# Patient Record
Sex: Male | Born: 1950 | Race: Black or African American | Hispanic: No | Marital: Single | Smoking: Never smoker
Health system: Southern US, Community
[De-identification: ages and names within clinical notes are randomized; demographics above are authoritative.]

## PROBLEM LIST (undated history)

## (undated) DIAGNOSIS — R17 Unspecified jaundice: Secondary | ICD-10-CM

## (undated) DIAGNOSIS — R0683 Snoring: Secondary | ICD-10-CM

## (undated) DIAGNOSIS — I10 Essential (primary) hypertension: Secondary | ICD-10-CM

## (undated) DIAGNOSIS — R5383 Other fatigue: Secondary | ICD-10-CM

## (undated) DIAGNOSIS — N529 Male erectile dysfunction, unspecified: Secondary | ICD-10-CM

## (undated) DIAGNOSIS — R7303 Prediabetes: Secondary | ICD-10-CM

## (undated) DIAGNOSIS — M109 Gout, unspecified: Secondary | ICD-10-CM

## (undated) DIAGNOSIS — K8020 Calculus of gallbladder without cholecystitis without obstruction: Secondary | ICD-10-CM

## (undated) HISTORY — DX: Unspecified jaundice: R17

## (undated) HISTORY — DX: Other fatigue: R53.83

## (undated) HISTORY — PX: MOUTH SURGERY: SHX715

## (undated) HISTORY — DX: Prediabetes: R73.03

## (undated) HISTORY — DX: Snoring: R06.83

## (undated) HISTORY — DX: Calculus of gallbladder without cholecystitis without obstruction: K80.20

## (undated) HISTORY — DX: Male erectile dysfunction, unspecified: N52.9

---

## 2001-05-02 ENCOUNTER — Inpatient Hospital Stay (HOSPITAL_COMMUNITY): Admission: EM | Admit: 2001-05-02 | Discharge: 2001-05-06 | Payer: Self-pay | Admitting: Emergency Medicine

## 2001-05-02 ENCOUNTER — Encounter: Payer: Self-pay | Admitting: Emergency Medicine

## 2005-01-24 ENCOUNTER — Inpatient Hospital Stay: Payer: Self-pay | Admitting: Anesthesiology

## 2005-07-16 ENCOUNTER — Emergency Department: Payer: Self-pay | Admitting: Emergency Medicine

## 2005-07-16 ENCOUNTER — Other Ambulatory Visit: Payer: Self-pay

## 2007-04-08 ENCOUNTER — Emergency Department: Payer: Self-pay | Admitting: Emergency Medicine

## 2009-01-21 ENCOUNTER — Emergency Department: Payer: Self-pay | Admitting: Emergency Medicine

## 2012-11-12 DIAGNOSIS — D369 Benign neoplasm, unspecified site: Secondary | ICD-10-CM

## 2012-11-12 HISTORY — PX: COLONOSCOPY: SHX174

## 2012-11-12 HISTORY — DX: Benign neoplasm, unspecified site: D36.9

## 2013-05-20 ENCOUNTER — Ambulatory Visit: Payer: Self-pay | Admitting: Gastroenterology

## 2013-05-21 LAB — PATHOLOGY REPORT

## 2014-11-25 ENCOUNTER — Emergency Department: Payer: Self-pay | Admitting: Emergency Medicine

## 2015-05-02 ENCOUNTER — Other Ambulatory Visit
Admission: RE | Admit: 2015-05-02 | Discharge: 2015-05-02 | Disposition: A | Discharge status: Home / Self Care | Payer: BLUE CROSS/BLUE SHIELD | Source: Other Acute Inpatient Hospital | Attending: Rheumatology | Admitting: Rheumatology

## 2015-05-02 DIAGNOSIS — M25562 Pain in left knee: Secondary | ICD-10-CM | POA: Diagnosis not present

## 2015-05-02 LAB — SYNOVIAL CELL COUNT + DIFF, W/ CRYSTALS
Crystals, Fluid: NONE SEEN
Eosinophils-Synovial: 0 %
Lymphocytes-Synovial Fld: 81 %
Monocyte-Macrophage-Synovial Fluid: 14 %
Neutrophil, Synovial: 5 %
Other Cells-SYN: 0
WBC, Synovial: 569 /mm3 — ABNORMAL HIGH (ref 0–200)

## 2021-11-12 DIAGNOSIS — C229 Malignant neoplasm of liver, not specified as primary or secondary: Secondary | ICD-10-CM

## 2021-11-12 HISTORY — DX: Malignant neoplasm of liver, not specified as primary or secondary: C22.9

## 2021-12-01 ENCOUNTER — Emergency Department
Admission: EM | Admit: 2021-12-01 | Discharge: 2021-12-01 | Discharge status: Against Medical Advice | Payer: Medicare HMO | Attending: Emergency Medicine | Admitting: Emergency Medicine

## 2021-12-01 ENCOUNTER — Other Ambulatory Visit: Payer: Self-pay

## 2021-12-01 ENCOUNTER — Encounter: Payer: Self-pay | Admitting: Emergency Medicine

## 2021-12-01 ENCOUNTER — Emergency Department: Payer: Medicare HMO

## 2021-12-01 DIAGNOSIS — R0602 Shortness of breath: Secondary | ICD-10-CM | POA: Diagnosis present

## 2021-12-01 DIAGNOSIS — Z5321 Procedure and treatment not carried out due to patient leaving prior to being seen by health care provider: Secondary | ICD-10-CM | POA: Insufficient documentation

## 2021-12-01 HISTORY — DX: Gout, unspecified: M10.9

## 2021-12-01 HISTORY — DX: Essential (primary) hypertension: I10

## 2021-12-01 LAB — BASIC METABOLIC PANEL
Anion gap: 5 (ref 5–15)
BUN: 15 mg/dL (ref 8–23)
CO2: 30 mmol/L (ref 22–32)
Calcium: 8.8 mg/dL — ABNORMAL LOW (ref 8.9–10.3)
Chloride: 97 mmol/L — ABNORMAL LOW (ref 98–111)
Creatinine, Ser: 1.46 mg/dL — ABNORMAL HIGH (ref 0.61–1.24)
GFR, Estimated: 51 mL/min — ABNORMAL LOW (ref >60)
Glucose, Bld: 219 mg/dL — ABNORMAL HIGH (ref 70–99)
Potassium: 4.4 mmol/L (ref 3.5–5.1)
Sodium: 132 mmol/L — ABNORMAL LOW (ref 135–145)

## 2021-12-01 LAB — CBC
HCT: 47.5 % (ref 39.0–52.0)
Hemoglobin: 16 g/dL (ref 13.0–17.0)
MCH: 28.9 pg (ref 26.0–34.0)
MCHC: 33.7 g/dL (ref 30.0–36.0)
MCV: 85.9 fL (ref 80.0–100.0)
Platelets: 142 10*3/uL — ABNORMAL LOW (ref 150–400)
RBC: 5.53 MIL/uL (ref 4.22–5.81)
RDW: 15.8 % — ABNORMAL HIGH (ref 11.5–15.5)
WBC: 8.4 10*3/uL (ref 4.0–10.5)
nRBC: 0 % (ref 0.0–0.2)

## 2021-12-01 NOTE — ED Triage Notes (Signed)
SOB with exertion x 2-3 days.  Also some noticed when laying down.

## 2021-12-04 ENCOUNTER — Other Ambulatory Visit
Admission: RE | Admit: 2021-12-04 | Discharge: 2021-12-04 | Disposition: A | Discharge status: Home / Self Care | Payer: Medicare HMO | Source: Ambulatory Visit | Attending: Internal Medicine | Admitting: Internal Medicine

## 2021-12-04 ENCOUNTER — Other Ambulatory Visit: Payer: Self-pay | Admitting: Internal Medicine

## 2021-12-04 DIAGNOSIS — R0602 Shortness of breath: Secondary | ICD-10-CM | POA: Diagnosis present

## 2021-12-04 DIAGNOSIS — R5383 Other fatigue: Secondary | ICD-10-CM

## 2021-12-04 DIAGNOSIS — R17 Unspecified jaundice: Secondary | ICD-10-CM

## 2021-12-04 LAB — D-DIMER, QUANTITATIVE: D-Dimer, Quant: >20 ug/mL-FEU — ABNORMAL HIGH (ref 0.00–0.50)

## 2021-12-04 LAB — TROPONIN I (HIGH SENSITIVITY): Troponin I (High Sensitivity): 5 ng/L (ref <18)

## 2021-12-05 ENCOUNTER — Other Ambulatory Visit: Payer: Self-pay | Admitting: Internal Medicine

## 2021-12-05 ENCOUNTER — Ambulatory Visit: Admission: RE | Admit: 2021-12-05 | Payer: Medicare HMO | Source: Ambulatory Visit

## 2021-12-05 ENCOUNTER — Ambulatory Visit
Admission: RE | Admit: 2021-12-05 | Discharge: 2021-12-05 | Disposition: A | Discharge status: Home / Self Care | Payer: Medicare HMO | Source: Ambulatory Visit | Attending: Internal Medicine | Admitting: Internal Medicine

## 2021-12-05 ENCOUNTER — Ambulatory Visit: Payer: Medicare HMO

## 2021-12-05 DIAGNOSIS — R17 Unspecified jaundice: Secondary | ICD-10-CM | POA: Insufficient documentation

## 2021-12-05 DIAGNOSIS — R0602 Shortness of breath: Secondary | ICD-10-CM

## 2021-12-05 DIAGNOSIS — R5383 Other fatigue: Secondary | ICD-10-CM

## 2021-12-05 DIAGNOSIS — R7989 Other specified abnormal findings of blood chemistry: Secondary | ICD-10-CM | POA: Diagnosis present

## 2021-12-05 MED ORDER — IOHEXOL 350 MG/ML SOLN
75.0000 mL | Freq: Once | INTRAVENOUS | Status: AC | PRN
  Administered 2021-12-05: 10:00:00 75 mL via INTRAVENOUS

## 2021-12-11 ENCOUNTER — Inpatient Hospital Stay
Admission: EM | Admit: 2021-12-11 | Discharge: 2021-12-14 | DRG: 436 | Disposition: A | Discharge status: Home / Self Care | Payer: Medicare HMO | Attending: Hospitalist | Admitting: Hospitalist

## 2021-12-11 ENCOUNTER — Telehealth: Payer: Self-pay

## 2021-12-11 ENCOUNTER — Other Ambulatory Visit: Payer: Self-pay

## 2021-12-11 ENCOUNTER — Inpatient Hospital Stay: Payer: Medicare HMO | Attending: Oncology | Admitting: Oncology

## 2021-12-11 ENCOUNTER — Inpatient Hospital Stay: Payer: Medicare HMO

## 2021-12-11 ENCOUNTER — Inpatient Hospital Stay
Admission: RE | Admit: 2021-12-11 | Discharge: 2021-12-11 | Disposition: A | Discharge status: Home / Self Care | Payer: Medicare HMO | Source: Ambulatory Visit | Attending: Oncology | Admitting: Oncology

## 2021-12-11 ENCOUNTER — Telehealth: Payer: Self-pay | Admitting: *Deleted

## 2021-12-11 ENCOUNTER — Encounter: Payer: Self-pay | Admitting: Emergency Medicine

## 2021-12-11 ENCOUNTER — Encounter: Payer: Self-pay | Admitting: *Deleted

## 2021-12-11 VITALS — BP 109/80 | HR 86 | Temp 96.5°F | Resp 18 | Wt 236.9 lb

## 2021-12-11 DIAGNOSIS — Z7189 Other specified counseling: Secondary | ICD-10-CM

## 2021-12-11 DIAGNOSIS — I4892 Unspecified atrial flutter: Secondary | ICD-10-CM | POA: Diagnosis present

## 2021-12-11 DIAGNOSIS — C259 Malignant neoplasm of pancreas, unspecified: Secondary | ICD-10-CM | POA: Diagnosis present

## 2021-12-11 DIAGNOSIS — R634 Abnormal weight loss: Secondary | ICD-10-CM | POA: Diagnosis present

## 2021-12-11 DIAGNOSIS — K869 Disease of pancreas, unspecified: Secondary | ICD-10-CM | POA: Diagnosis not present

## 2021-12-11 DIAGNOSIS — E871 Hypo-osmolality and hyponatremia: Secondary | ICD-10-CM | POA: Diagnosis present

## 2021-12-11 DIAGNOSIS — R970 Elevated carcinoembryonic antigen [CEA]: Secondary | ICD-10-CM | POA: Diagnosis not present

## 2021-12-11 DIAGNOSIS — R978 Other abnormal tumor markers: Secondary | ICD-10-CM | POA: Diagnosis not present

## 2021-12-11 DIAGNOSIS — M109 Gout, unspecified: Secondary | ICD-10-CM | POA: Insufficient documentation

## 2021-12-11 DIAGNOSIS — D3501 Benign neoplasm of right adrenal gland: Secondary | ICD-10-CM | POA: Insufficient documentation

## 2021-12-11 DIAGNOSIS — C252 Malignant neoplasm of tail of pancreas: Secondary | ICD-10-CM | POA: Diagnosis present

## 2021-12-11 DIAGNOSIS — I129 Hypertensive chronic kidney disease with stage 1 through stage 4 chronic kidney disease, or unspecified chronic kidney disease: Secondary | ICD-10-CM | POA: Diagnosis present

## 2021-12-11 DIAGNOSIS — I10 Essential (primary) hypertension: Secondary | ICD-10-CM | POA: Diagnosis present

## 2021-12-11 DIAGNOSIS — R160 Hepatomegaly, not elsewhere classified: Secondary | ICD-10-CM

## 2021-12-11 DIAGNOSIS — K7460 Unspecified cirrhosis of liver: Secondary | ICD-10-CM | POA: Diagnosis present

## 2021-12-11 DIAGNOSIS — K8021 Calculus of gallbladder without cholecystitis with obstruction: Secondary | ICD-10-CM | POA: Diagnosis present

## 2021-12-11 DIAGNOSIS — R17 Unspecified jaundice: Secondary | ICD-10-CM

## 2021-12-11 DIAGNOSIS — C787 Secondary malignant neoplasm of liver and intrahepatic bile duct: Secondary | ICD-10-CM

## 2021-12-11 DIAGNOSIS — Z8249 Family history of ischemic heart disease and other diseases of the circulatory system: Secondary | ICD-10-CM | POA: Diagnosis not present

## 2021-12-11 DIAGNOSIS — E878 Other disorders of electrolyte and fluid balance, not elsewhere classified: Secondary | ICD-10-CM | POA: Diagnosis present

## 2021-12-11 DIAGNOSIS — K838 Other specified diseases of biliary tract: Secondary | ICD-10-CM | POA: Diagnosis not present

## 2021-12-11 DIAGNOSIS — N281 Cyst of kidney, acquired: Secondary | ICD-10-CM | POA: Insufficient documentation

## 2021-12-11 DIAGNOSIS — Z79899 Other long term (current) drug therapy: Secondary | ICD-10-CM

## 2021-12-11 DIAGNOSIS — R6881 Early satiety: Secondary | ICD-10-CM | POA: Diagnosis present

## 2021-12-11 DIAGNOSIS — K8689 Other specified diseases of pancreas: Secondary | ICD-10-CM | POA: Diagnosis not present

## 2021-12-11 DIAGNOSIS — I4891 Unspecified atrial fibrillation: Secondary | ICD-10-CM | POA: Diagnosis present

## 2021-12-11 DIAGNOSIS — I493 Ventricular premature depolarization: Secondary | ICD-10-CM | POA: Diagnosis present

## 2021-12-11 DIAGNOSIS — N183 Chronic kidney disease, stage 3 unspecified: Secondary | ICD-10-CM | POA: Diagnosis present

## 2021-12-11 DIAGNOSIS — R0989 Other specified symptoms and signs involving the circulatory and respiratory systems: Secondary | ICD-10-CM | POA: Diagnosis not present

## 2021-12-11 DIAGNOSIS — I700 Atherosclerosis of aorta: Secondary | ICD-10-CM | POA: Diagnosis not present

## 2021-12-11 DIAGNOSIS — Z801 Family history of malignant neoplasm of trachea, bronchus and lung: Secondary | ICD-10-CM | POA: Diagnosis not present

## 2021-12-11 DIAGNOSIS — N1831 Chronic kidney disease, stage 3a: Secondary | ICD-10-CM | POA: Diagnosis present

## 2021-12-11 DIAGNOSIS — Z20822 Contact with and (suspected) exposure to covid-19: Secondary | ICD-10-CM | POA: Diagnosis present

## 2021-12-11 DIAGNOSIS — R932 Abnormal findings on diagnostic imaging of liver and biliary tract: Secondary | ICD-10-CM | POA: Insufficient documentation

## 2021-12-11 LAB — HEPATIC FUNCTION PANEL
ALT: 35 U/L (ref 0–44)
AST: 37 U/L (ref 15–41)
Albumin: 2.7 g/dL — ABNORMAL LOW (ref 3.5–5.0)
Alkaline Phosphatase: 225 U/L — ABNORMAL HIGH (ref 38–126)
Bilirubin, Direct: 14.3 mg/dL — ABNORMAL HIGH (ref 0.0–0.2)
Indirect Bilirubin: 5.5 mg/dL — ABNORMAL HIGH (ref 0.3–0.9)
Total Bilirubin: 19.8 mg/dL (ref 0.3–1.2)
Total Protein: 7.6 g/dL (ref 6.5–8.1)

## 2021-12-11 LAB — BASIC METABOLIC PANEL
Anion gap: 8 (ref 5–15)
BUN: 20 mg/dL (ref 8–23)
CO2: 26 mmol/L (ref 22–32)
Calcium: 8.6 mg/dL — ABNORMAL LOW (ref 8.9–10.3)
Chloride: 96 mmol/L — ABNORMAL LOW (ref 98–111)
Creatinine, Ser: 1.13 mg/dL (ref 0.61–1.24)
GFR, Estimated: >60 mL/min (ref >60)
Glucose, Bld: 142 mg/dL — ABNORMAL HIGH (ref 70–99)
Potassium: 4.5 mmol/L (ref 3.5–5.1)
Sodium: 130 mmol/L — ABNORMAL LOW (ref 135–145)

## 2021-12-11 LAB — HEPATITIS PANEL, ACUTE
HCV Ab: NONREACTIVE
Hep A IgM: NONREACTIVE
Hep B C IgM: NONREACTIVE
Hepatitis B Surface Ag: NONREACTIVE

## 2021-12-11 LAB — CBC WITH DIFFERENTIAL/PLATELET
Abs Immature Granulocytes: 0.04 10*3/uL (ref 0.00–0.07)
Basophils Absolute: 0 10*3/uL (ref 0.0–0.1)
Basophils Relative: 1 %
Eosinophils Absolute: 0.1 10*3/uL (ref 0.0–0.5)
Eosinophils Relative: 2 %
HCT: 42.1 % (ref 39.0–52.0)
Hemoglobin: 15.7 g/dL (ref 13.0–17.0)
Immature Granulocytes: 1 %
Lymphocytes Relative: 10 %
Lymphs Abs: 0.8 10*3/uL (ref 0.7–4.0)
MCH: 29.8 pg (ref 26.0–34.0)
MCHC: 37.3 g/dL — ABNORMAL HIGH (ref 30.0–36.0)
MCV: 79.9 fL — ABNORMAL LOW (ref 80.0–100.0)
Monocytes Absolute: 0.8 10*3/uL (ref 0.1–1.0)
Monocytes Relative: 10 %
Neutro Abs: 6 10*3/uL (ref 1.7–7.7)
Neutrophils Relative %: 76 %
Platelets: 157 10*3/uL (ref 150–400)
RBC: 5.27 MIL/uL (ref 4.22–5.81)
RDW: 17.3 % — ABNORMAL HIGH (ref 11.5–15.5)
WBC: 7.8 10*3/uL (ref 4.0–10.5)
nRBC: 0 % (ref 0.0–0.2)

## 2021-12-11 LAB — PROTIME-INR
INR: 1 (ref 0.8–1.2)
Prothrombin Time: 13.1 seconds (ref 11.4–15.2)

## 2021-12-11 LAB — RESP PANEL BY RT-PCR (FLU A&B, COVID) ARPGX2
Influenza A by PCR: NEGATIVE
Influenza B by PCR: NEGATIVE
SARS Coronavirus 2 by RT PCR: NEGATIVE

## 2021-12-11 LAB — IRON AND TIBC
Iron: 101 ug/dL (ref 45–182)
Saturation Ratios: 42 % — ABNORMAL HIGH (ref 17.9–39.5)
TIBC: 238 ug/dL — ABNORMAL LOW (ref 250–450)
UIBC: 137 ug/dL

## 2021-12-11 LAB — FERRITIN: Ferritin: 797 ng/mL — ABNORMAL HIGH (ref 24–336)

## 2021-12-11 LAB — LACTATE DEHYDROGENASE: LDH: 131 U/L (ref 98–192)

## 2021-12-11 LAB — APTT: aPTT: 25 seconds (ref 24–36)

## 2021-12-11 MED ORDER — ONDANSETRON HCL 4 MG/2ML IJ SOLN
4.0000 mg | Freq: Four times a day (QID) | INTRAMUSCULAR | Status: DC | PRN
Start: 2021-12-11 — End: 2021-12-14

## 2021-12-11 MED ORDER — TRAZODONE HCL 50 MG PO TABS
25.0000 mg | ORAL_TABLET | Freq: Every evening | ORAL | Status: DC | PRN
  Administered 2021-12-12: 25 mg via ORAL
  Filled 2021-12-11: qty 1

## 2021-12-11 MED ORDER — SPIRONOLACTONE 25 MG PO TABS
25.0000 mg | ORAL_TABLET | Freq: Every day | ORAL | Status: DC
  Administered 2021-12-12 – 2021-12-13 (×2): 25 mg via ORAL
  Filled 2021-12-11 (×2): qty 1

## 2021-12-11 MED ORDER — ALLOPURINOL 100 MG PO TABS
300.0000 mg | ORAL_TABLET | Freq: Every day | ORAL | Status: DC
  Administered 2021-12-12 – 2021-12-14 (×3): 300 mg via ORAL
  Filled 2021-12-11: qty 3
  Filled 2021-12-11: qty 1
  Filled 2021-12-11: qty 3

## 2021-12-11 MED ORDER — ONDANSETRON HCL 4 MG PO TABS: 4.0000 mg | ORAL_TABLET | Freq: Four times a day (QID) | ORAL | Status: DC | PRN

## 2021-12-11 MED ORDER — ACETAMINOPHEN 650 MG RE SUPP: 650.0000 mg | Freq: Four times a day (QID) | RECTAL | Status: DC | PRN

## 2021-12-11 MED ORDER — MAGNESIUM HYDROXIDE 400 MG/5ML PO SUSP
30.0000 mL | Freq: Every day | ORAL | Status: DC | PRN
  Filled 2021-12-11: qty 30

## 2021-12-11 MED ORDER — GADOBUTROL 1 MMOL/ML IV SOLN
10.0000 mL | Freq: Once | INTRAVENOUS | Status: AC | PRN
  Administered 2021-12-11: 10 mL via INTRAVENOUS

## 2021-12-11 MED ORDER — CARVEDILOL 25 MG PO TABS
50.0000 mg | ORAL_TABLET | Freq: Two times a day (BID) | ORAL | Status: DC
  Administered 2021-12-11 – 2021-12-13 (×4): 50 mg via ORAL
  Filled 2021-12-11 (×4): qty 2

## 2021-12-11 MED ORDER — ACETAMINOPHEN 325 MG PO TABS
650.0000 mg | ORAL_TABLET | Freq: Four times a day (QID) | ORAL | Status: DC | PRN
  Administered 2021-12-13: 14:00:00 650 mg via ORAL
  Filled 2021-12-11: qty 2

## 2021-12-11 MED ORDER — ENOXAPARIN SODIUM 40 MG/0.4ML IJ SOSY
40.0000 mg | PREFILLED_SYRINGE | INTRAMUSCULAR | Status: DC
  Administered 2021-12-11: 40 mg via SUBCUTANEOUS
  Filled 2021-12-11: qty 0.4

## 2021-12-11 MED ORDER — LISINOPRIL 20 MG PO TABS
40.0000 mg | ORAL_TABLET | Freq: Every day | ORAL | Status: DC
  Administered 2021-12-12 – 2021-12-13 (×2): 40 mg via ORAL
  Filled 2021-12-11: qty 4
  Filled 2021-12-11: qty 2

## 2021-12-11 NOTE — Progress Notes (Signed)
Patient here to establish care  

## 2021-12-11 NOTE — ED Notes (Signed)
ED Provider at bedside. 

## 2021-12-11 NOTE — Progress Notes (Signed)
Hematology/Oncology Consult note [phone] [phone]      Patient Care Team: Kirk Ruths, MD as PCP - General (Internal Medicine) Clent Jacks, RN as Oncology Nurse Navigator Earlie Server, MD as Consulting Physician (Hematology and Oncology) Emmaline Kluver., MD as Consulting Physician (Rheumatology) Efrain Sella, MD as Consulting Physician (Gastroenterology)  REFERRING PROVIDER: Kirk Ruths, MD  CHIEF COMPLAINTS/REASON FOR VISIT:  Evaluation of liver mass  HISTORY OF PRESENTING ILLNESS:   Ivan Pope is a  71 y.o.  male with PMH listed below was seen in consultation at the request of  Kirk Ruths, MD  for evaluation of liver mass  Patient presented emergency room on 12/01/2021 for evaluation of shortness of breath with exertion.  Patient left without being seen. 12/04/2021 patient was seen by primary care provider Dr. Ouida Sills.  Blood work showed a total bilirubin level of 7.5, alkaline phosphatase 295, albumin 3.4, creatinine 1.5. 12/05/2021, CT angio chest pulmonary embolism protocol showed no PE.  There were multiple ill-defined hepatic lobe densities consistent with hepatic metastatic disease.   12/05/2022, right upper quadrant abdomen ultrasound showed nodular surface contour suggesting background of cirrhosis.  Numerous heterogeneous liver masses consistent with metastatic disease.  Contracted gallbladder.  No bile duct dilatation. Patient reports unintentional weight loss of 20 pounds within the past few months.  He was accompanied by his friend [place].  Denies abdominal pain.  Appetite is fair.  Normal bowel movement.  No nausea vomiting diarrhea. Family history of mother passed away from lung cancer in 2010.  Denies any other family history of cancer.  Denies any black or bloody bowel movement.  Review of Systems  Constitutional:  Positive for appetite change, fatigue and unexpected weight change. Negative for  chills and fever.  HENT:   Negative for hearing loss and voice change.   Eyes:  Negative for eye problems and icterus.  Respiratory:  Positive for shortness of breath. Negative for chest tightness and cough.   Cardiovascular:  Negative for chest pain and leg swelling.  Gastrointestinal:  Negative for abdominal distention and abdominal pain.  Endocrine: Negative for hot flashes.  Genitourinary:  Negative for difficulty urinating, dysuria and frequency.   Musculoskeletal:  Negative for arthralgias.  Skin:  Negative for itching and rash.  Neurological:  Negative for light-headedness and numbness.  Hematological:  Negative for adenopathy. Does not bruise/bleed easily.  Psychiatric/Behavioral:  Negative for confusion.    MEDICAL HISTORY:  Past Medical History:  Diagnosis Date   Adenomatous polyp 2014   Elevated bilirubin    Erectile dysfunction    Fatigue    Gallstones    Gout    Hypertension    Liver cancer ([place]) 2023   suspected   Prediabetes    Snoring     SURGICAL HISTORY: Past Surgical History:  Procedure Laterality Date   COLONOSCOPY  2014   Dr. Darrold Span @ [place] - Adenomatous Polyp, rpt 5 yrs per [facility]   MOUTH SURGERY      SOCIAL HISTORY: Social History   Socioeconomic History   Marital status: Single    Spouse name: Not on file   Number of children: Not on file   Years of education: Not on file   Highest education level: Not on file  Occupational History   Not on file  Tobacco Use   Smoking status: Never    Passive exposure: Never   Smokeless tobacco: Never  Vaping Use   Vaping Use: Never used  Substance and Sexual Activity   Alcohol use: Not Currently   Drug use: Not on file   Sexual activity: Not on file  Other Topics Concern   Not on file  Social History Narrative   Not on file   Social Determinants of Health   Financial Resource Strain: Not on file  Food Insecurity: Not on file  Transportation Needs: Not on file  Physical Activity: Not on file   Stress: Not on file  Social Connections: Not on file  Intimate Partner Violence: Not on file    FAMILY HISTORY: Family History  Problem Relation Age of Onset   Lung cancer Mother    Hypertension Daughter     ALLERGIES:  has No Known Allergies.  MEDICATIONS:  Current Outpatient Medications  Medication Sig Dispense Refill   allopurinol (ZYLOPRIM) 100 MG tablet TAKE 1 TABLET BY MOUTH ONCE DAILY (TAKE  WITH  300MG   TABLET  DAILY  FOR  A  TOTAL  DAILY  DOSE  OF  400MG )     allopurinol (ZYLOPRIM) 300 MG tablet TAKE 1 TABLET BY MOUTH ONCE DAILY (TAKE ALONG WITH 100MG  TABLET)     carvedilol (COREG) 25 MG tablet Take 50 mg by mouth 2 (two) times daily.     lisinopril (ZESTRIL) 40 MG tablet Take 40 mg by mouth daily.     spironolactone (ALDACTONE) 25 MG tablet Take 25 mg by mouth daily.     tadalafil (CIALIS) 5 MG tablet Take 5 mg by mouth daily.     No current facility-administered medications for this visit.     PHYSICAL EXAMINATION: ECOG PERFORMANCE STATUS: 2 - Symptomatic, <50% confined to bed Vitals:   12/11/21 1112  BP: 109/80  Pulse: 86  Resp: 18  Temp: (!) 96.5 F (35.8 C)  SpO2: 98%   Filed Weights   12/11/21 1112  Weight: 236 lb 14.4 oz (107.5 kg)    Physical Exam Constitutional:      General: He is not in acute distress. HENT:     Head: Normocephalic and atraumatic.  Eyes:     General: Scleral icterus present.  Cardiovascular:     Rate and Rhythm: Normal rate and regular rhythm.     Heart sounds: Normal heart sounds.  Pulmonary:     Effort: Pulmonary effort is normal. No respiratory distress.     Breath sounds: No wheezing.  Abdominal:     General: Bowel sounds are normal. There is no distension.     Palpations: Abdomen is soft.  Musculoskeletal:        General: No deformity. Normal range of motion.     Cervical back: Normal range of motion and neck supple.  Skin:    General: Skin is warm and dry.     Findings: No erythema or rash.  Neurological:      Mental Status: He is alert and oriented to person, place, and time. Mental status is at baseline.     Cranial Nerves: No cranial nerve deficit.     Coordination: Coordination normal.  Psychiatric:        Mood and Affect: Mood normal.    LABORATORY DATA:  I have reviewed the data as listed Lab Results  Component Value Date   WBC 7.8 12/11/2021   HGB 15.7 12/11/2021   HCT 42.1 12/11/2021   MCV 79.9 (L) 12/11/2021   PLT 157 12/11/2021   Recent Labs    12/01/21 1333 12/11/21 1158  NA 132* 130*  K 4.4 4.5  CL  97* 96*  CO2 30 26  GLUCOSE 219* 142*  BUN 15 20  CREATININE 1.46* 1.13  CALCIUM 8.8* 8.6*  GFRNONAA 51* >60  PROT  --  7.6  ALBUMIN  --  2.7*  AST  --  37  ALT  --  35  ALKPHOS  --  225*  BILITOT  --  19.8*  BILIDIR  --  14.3*  IBILI  --  5.5*   Iron/TIBC/Ferritin/ %Sat    Component Value Date/Time   IRON 101 12/11/2021 1158   TIBC 238 (L) 12/11/2021 1158   FERRITIN 797 (H) 12/11/2021 1158   IRONPCTSAT 42 (H) 12/11/2021 1158      RADIOGRAPHIC STUDIES: I have personally reviewed the radiological images as listed and agreed with the findings in the report. DG Chest 2 View  Result Date: 12/01/2021 CLINICAL DATA:  Shortness of breath with exertion. EXAM: CHEST - 2 VIEW COMPARISON:  None. FINDINGS: The lungs are clear without focal pneumonia, edema, pneumothorax or pleural effusion. Cardiopericardial silhouette is at upper limits of normal for size. There is pulmonary vascular congestion without overt pulmonary edema. Streaky opacity at the left base suggest atelectasis. The visualized bony structures of the thorax show no acute abnormality. IMPRESSION: Pulmonary vascular congestion without overt pulmonary edema. Electronically Signed   By: Misty Stanley M.D.   On: 12/01/2021 15:08   CT Angio Chest Pulmonary Embolism (PE) W or WO Contrast  Result Date: 12/05/2021 CLINICAL DATA:  Shortness of breath. EXAM: CT ANGIOGRAPHY CHEST WITH CONTRAST TECHNIQUE:  Multidetector CT imaging of the chest was performed using the standard protocol during bolus administration of intravenous contrast. Multiplanar CT image reconstructions and MIPs were obtained to evaluate the vascular anatomy. RADIATION DOSE REDUCTION: This exam was performed according to the departmental dose-optimization program which includes automated exposure control, adjustment of the mA and/or kV according to patient size and/or use of iterative reconstruction technique. CONTRAST:  1mL OMNIPAQUE IOHEXOL 350 MG/ML SOLN COMPARISON:  December 01, 2021. FINDINGS: Cardiovascular: Satisfactory opacification of the pulmonary arteries to the segmental level. No evidence of pulmonary embolism. Normal heart size. No pericardial effusion. Atherosclerosis of thoracic aorta is noted without aneurysm formation. Mediastinum/Nodes: No enlarged mediastinal, hilar, or axillary lymph nodes. Thyroid gland, trachea, and esophagus demonstrate no significant findings. Lungs/Pleura: No pneumothorax or pleural effusion is noted. 11 x 10 mm ill-defined sub solid density is noted in right lower lobe. Mild left posterior basilar subsegmental atelectasis is noted. Upper Abdomen: Multiple ill-defined low densities are noted throughout hepatic parenchyma consistent with metastatic disease. Musculoskeletal: No chest wall abnormality. No acute or significant osseous findings. Review of the MIP images confirms the above findings. IMPRESSION: No definite evidence of pulmonary embolus. Multiple ill-defined hepatic low densities are noted consistent with hepatic metastatic disease. These results will be called to the ordering clinician or representative by the Radiologist Assistant, and communication documented in the PACS or [employer] Dashboard. 10 x 11 mm ill-defined sub solid density is noted in right lower lobe. Initial follow-up by chest CT without contrast is recommended in 3 months to confirm persistence. This recommendation follows the  consensus statement: Recommendations for the Management of Subsolid Pulmonary Nodules Detected at CT: A Statement from the [place] as published in Radiology 2013; 266:304-317. Aortic Atherosclerosis (ICD10-I70.0). Electronically Signed   By: Marijo Conception M.D.   On: 12/05/2021 10:53   MR Abdomen W Wo Contrast  Result Date: 12/11/2021 CLINICAL DATA:  Evaluate liver lesions identified on recent CT. EXAM: MRI ABDOMEN WITHOUT  AND WITH CONTRAST TECHNIQUE: Multiplanar multisequence MR imaging of the abdomen was performed both before and after the administration of intravenous contrast. CONTRAST:  45mL GADAVIST GADOBUTROL 1 MMOL/ML IV SOLN COMPARISON:  12/05/2021 FINDINGS: Lower chest: Trace left pleural effusion Hepatobiliary: There are innumerable, peripherally enhancing, T2 hyperintense and T1 hypointense lesions scattered throughout both lobes of liver compatible with liver metastases. Lesions are too numerous to count, including: Index lesion within segment 2 measures 2.9 x 2.4 cm, image 26/5. Index lesion within segment 3 measures 2.6 x 2.2 cm, image 42/15. Index lesion within segment 6 measures 2.1 x 2.2 cm, image 53/15. Index lesion within segment 7/8 measures 2.8 x 1.9 cm, image 23/15. Index lesion central aspect of the liver there is a 2.7 by 1.9 cm, image 40/17. This results in central intrahepatic biliary dilatation with mild dilatation of the peripheral biliary radicles in the right hepatic lobe. Gallbladder appears collapsed with a stone identified within the fundus. The CBD has a normal caliber. There is dilatation of the peripheral biliary radicles within both lobes of liver likely reflecting mass effect from the diffuse liver metastases. Pancreas: Mass arising off the distal tail of pancreas invades the splenic hilum. The tumor also extends to involve the capsule overlying the upper pole of the left kidney, image 26/19. This measures 3.7 by 3.8 cm, image 45/13 and is highly concerning for  primary pancreatic adenocarcinoma. No signs of pancreatic inflammation or main duct dilatation. Spleen: Tail of pancreas mass is seen invading the splenic hilum, image 24/19. Adrenals/Urinary Tract: There is a peripherally enhancing nodule involving the right adrenal gland measuring 1.6 cm, image 39/20. There is loss of signal within this nodule between the inphase and out of phase sequences compatible with a benign adenoma. Hypertrophy of the left adrenal gland noted without discrete mass. Multiple small bilateral kidney cysts are identified, which are technically too small to reliably characterize. No signs of hydronephrosis or mass. Stomach/Bowel: Visualized portions within the abdomen are unremarkable. Vascular/Lymphatic: No pathologically enlarged lymph nodes identified. No abdominal aortic aneurysm demonstrated. Aortic atherosclerosis. Other:  No free fluid or fluid collections identified. Musculoskeletal: No suspicious bone lesions identified. IMPRESSION: 1. There is a large mass arising off the distal tail of pancreas invading the splenic hilum and invading the capsule overlying the upper pole of the left kidney. This is highly concerning for primary pancreatic adenocarcinoma. 2. Innumerable, peripherally enhancing lesions scattered throughout both lobes of liver compatible with liver metastases. 3. Mild intrahepatic bile duct dilatation secondary to mass effect from central liver metastases. No common bile duct dilatation identified. 4. Right adrenal gland adenoma. These results will be called to the ordering clinician or representative by the Radiologist Assistant, and communication documented in the PACS or [employer]. Electronically Signed   By: Kerby Moors M.D.   On: 12/11/2021 13:16   US Abdomen Limited RUQ (LIVER/GB)  Result Date: 12/05/2021 CLINICAL DATA:  Elevated bilirubin EXAM: ULTRASOUND ABDOMEN LIMITED RIGHT UPPER QUADRANT COMPARISON:  CT a chest 12/05/2021 FINDINGS: Gallbladder:  The gallbladder is contracted. Gallbladder appears impacted with shadowing stones. The wall appears mildly thickened measuring up to 3 mm, though this is likely due to contraction. There is no pericholecystic fluid. Sonographic Murphy's sign was reported negative. Common bile duct: Diameter: 3 mm Liver: The liver is markedly heterogeneous in echotexture. The liver surface contour is nodular. Numerous heterogeneously echoic masses are seen corresponding to the low-density lesion seen on the prior CT chest. Portal vein is patent on color Doppler imaging  with normal direction of blood flow towards the liver. Other: None. IMPRESSION: 1. Nodular surface contour suggesting a background of cirrhosis. 2. Numerous heterogeneously echoic masses consistent with metastatic disease as seen on the prior CTA chest. 3. Contracted gallbladder which is impacted with gallstones. Mild gallbladder wall thickening is likely due to contraction. There is no pericholecystic fluid, and sonographic Murphy's sign was reported negative. 4. No biliary ductal dilatation. Electronically Signed   By: Valetta Mole M.D.   On: 12/05/2021 11:30      ASSESSMENT & PLAN:  1. Liver metastasis ([place])   2. Hyperbilirubinemia   3. Unintentional weight loss   4. Goals of care, counseling/discussion   5. Pancreatic mass   With #Multiple ill-defined liver mass, in the context of radiographic findings of liver cirrhosis Primary liver neoplasm versus metastasis.  Discussed with patient about additional image work-up and biopsy to establish tissue diagnosis. Check CBC, LFT, BMP, AFP, CA 19-9, CEA, hepatitis panel, iron TIBC ferritin.  I will obtain stat MRI abdomen  with and without contrast for further evaluation. Today labs shows a total bilirubin of 19.8, direct bilirubin 14.3, indirect bilirubin 5.5.  Hyperbilirubinemia is likely due to intrahepatic biliary obstruction.  -MRI abdomen showed mass arising of distal tail of pancreas invading  splenic hilum, Capsule Overlying the Upper Pole of the Left Kidney, innumerable peripherally enhancing lesions scattered throughout both lobes of the liver, mild intrahepatic bile duct dilatation secondary to mass-effect from central liver metastasis.  No CBD dilatation.  Right adrenal gland adenoma.  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  He needs to establish tissue diagnosis ASAP. I advised patient to go to emergency room to expedite his cancer work-up.  Spoke with ER physician Dr. Tamala Julian to check coags, obtain ultrasound-guided liver biopsy.  Orders Placed This Encounter  Procedures   CBC with Differential/Platelet    Standing Status:   Future    Number of Occurrences:   1    Standing Expiration Date:   12/11/2022   Lactate dehydrogenase    Standing Status:   Future    Number of Occurrences:   1    Standing Expiration Date:   12/11/2022   AFP tumor marker    Standing Status:   Future    Number of Occurrences:   1    Standing Expiration Date:   12/11/2022   Ferritin    Standing Status:   Future    Number of Occurrences:   1    Standing Expiration Date:   06/10/2022   Iron and TIBC([facility])    Standing Status:   Future    Number of Occurrences:   1    Standing Expiration Date:   12/11/2022   CEA    Standing Status:   Future    Number of Occurrences:   1    Standing Expiration Date:   12/11/2022   Cancer antigen 19-9    Standing Status:   Future    Number of Occurrences:   1    Standing Expiration Date:   02/28/6221   Basic metabolic panel    Standing Status:   Future    Number of Occurrences:   1    Standing Expiration Date:   12/11/2022   Hepatic function panel    Standing Status:   Future    Number of Occurrences:   1    Standing Expiration Date:   12/11/2022   Hepatitis panel, acute    Standing Status:  Future    Number of Occurrences:   1    Standing Expiration Date:   12/11/2022    All questions were answered. The  patient knows to call the clinic with any problems questions or concerns.  cc Kirk Ruths, MD    Return of visit: To be determined. Thank you for this kind referral and the opportunity to participate in the care of this patient. A copy of today's note is routed to referring provider   Earlie Server, MD, PhD [facility] Hematology Oncology 12/11/2021

## 2021-12-11 NOTE — Progress Notes (Signed)
Met with Mr. Rieth and [facility] [place]. Introduced Therapist, nutritional and provided my contact information for future needs. Reviewed next steps, which includes MRI abdomen and labs. He will be contacted with MRI results and further steps.

## 2021-12-11 NOTE — ED Provider Notes (Signed)
[facility] Provider Note    Event Date/Time   First MD Initiated Contact with Patient 12/11/21 2013     (approximate)   History   Abnormal Lab   HPI  Ivan Pope is a 71 y.o. male who presents to the ED for evaluation of Abnormal Lab   I review outpatient PCP visit from 1/23.  History of HTN and gout. Review outpatient work-up for past 1.5 weeks with PCP and recent referral to medical oncology, Dr. Tasia Catchings.  I review outpatient blood work from this morning and MRI of the abdomen concerning for primary pancreatic cancer with mets to the liver.  Bilirubin of 19.8.  Patient presents to the ED at the direction of his oncologist due to these abnormal outpatient tests.  He reports subacute poor appetite, weight loss, fatigue and poor sleep at night.  Physical Exam   Triage Vital Signs: ED Triage Vitals  Enc Vitals Group     BP 12/11/21 1625 97/74     Pulse Rate 12/11/21 1625 91     Resp 12/11/21 1625 18     Temp 12/11/21 1625 98.1 F (36.7 C)     Temp Source 12/11/21 1625 Oral     SpO2 12/11/21 1625 95 %     Weight 12/11/21 1624 211 lb (95.7 kg)     Height 12/11/21 1624 5' 11" (1.803 m)     Head Circumference --      Peak Flow --      Pain Score 12/11/21 1624 0     Pain Loc --      Pain Edu? --      Excl. in [employer]? --     Most recent vital signs: Vitals:   12/11/21 2039 12/11/21 2045  BP: (!) 123/92   Pulse:  70  Resp:    Temp:    SpO2:  99%    General: Awake, no distress.  Sitting up on the side of the bed, pleasant and conversational in full sentences.  Scleral icterus is present. CV:  Good peripheral perfusion.  Resp:  Normal effort.  Abd:  No distention.  Benign exam MSK:  No deformity noted.  Neuro:  No focal deficits appreciated. Other:     ED Results / Procedures / Treatments   Labs (all labs ordered are listed, but only abnormal results are displayed) Labs Reviewed  RESP PANEL BY RT-PCR (FLU A&B, COVID) ARPGX2   PROTIME-INR  APTT    EKG    RADIOLOGY Review outpatient MRI from earlier today with pancreatic mass and liver mets  Official radiology report(s): MR Abdomen W Wo Contrast  Result Date: 12/11/2021 CLINICAL DATA:  Evaluate liver lesions identified on recent CT. EXAM: MRI ABDOMEN WITHOUT AND WITH CONTRAST TECHNIQUE: Multiplanar multisequence MR imaging of the abdomen was performed both before and after the administration of intravenous contrast. CONTRAST:  46m GADAVIST GADOBUTROL 1 MMOL/ML IV SOLN COMPARISON:  12/05/2021 FINDINGS: Lower chest: Trace left pleural effusion Hepatobiliary: There are innumerable, peripherally enhancing, T2 hyperintense and T1 hypointense lesions scattered throughout both lobes of liver compatible with liver metastases. Lesions are too numerous to count, including: Index lesion within segment 2 measures 2.9 x 2.4 cm, image 26/5. Index lesion within segment 3 measures 2.6 x 2.2 cm, image 42/15. Index lesion within segment 6 measures 2.1 x 2.2 cm, image 53/15. Index lesion within segment 7/8 measures 2.8 x 1.9 cm, image 23/15. Index lesion central aspect of the liver there is a 2.7 by 1.9  cm, image 40/17. This results in central intrahepatic biliary dilatation with mild dilatation of the peripheral biliary radicles in the right hepatic lobe. Gallbladder appears collapsed with a stone identified within the fundus. The CBD has a normal caliber. There is dilatation of the peripheral biliary radicles within both lobes of liver likely reflecting mass effect from the diffuse liver metastases. Pancreas: Mass arising off the distal tail of pancreas invades the splenic hilum. The tumor also extends to involve the capsule overlying the upper pole of the left kidney, image 26/19. This measures 3.7 by 3.8 cm, image 45/13 and is highly concerning for primary pancreatic adenocarcinoma. No signs of pancreatic inflammation or main duct dilatation. Spleen: Tail of pancreas mass is seen  invading the splenic hilum, image 24/19. Adrenals/Urinary Tract: There is a peripherally enhancing nodule involving the right adrenal gland measuring 1.6 cm, image 39/20. There is loss of signal within this nodule between the inphase and out of phase sequences compatible with a benign adenoma. Hypertrophy of the left adrenal gland noted without discrete mass. Multiple small bilateral kidney cysts are identified, which are technically too small to reliably characterize. No signs of hydronephrosis or mass. Stomach/Bowel: Visualized portions within the abdomen are unremarkable. Vascular/Lymphatic: No pathologically enlarged lymph nodes identified. No abdominal aortic aneurysm demonstrated. Aortic atherosclerosis. Other:  No free fluid or fluid collections identified. Musculoskeletal: No suspicious bone lesions identified. IMPRESSION: 1. There is a large mass arising off the distal tail of pancreas invading the splenic hilum and invading the capsule overlying the upper pole of the left kidney. This is highly concerning for primary pancreatic adenocarcinoma. 2. Innumerable, peripherally enhancing lesions scattered throughout both lobes of liver compatible with liver metastases. 3. Mild intrahepatic bile duct dilatation secondary to mass effect from central liver metastases. No common bile duct dilatation identified. 4. Right adrenal gland adenoma. These results will be called to the ordering clinician or representative by the Radiologist Assistant, and communication documented in the PACS or [employer]. Electronically Signed   By: Kerby Moors M.D.   On: 12/11/2021 13:16    PROCEDURES and INTERVENTIONS:  Procedures  Medications - No data to display   IMPRESSION / MDM / [place] / ED COURSE  I reviewed the triage vital signs and the nursing notes.  71 year old male presents to the ED at the direction of his oncologist for evaluation of newly diagnosed likely pancreatic metastatic cancer.  He  looks clinically well, although jaundiced.  Normal vitals on room air.  Review of his outpatient work-up, documented above.  I consult with various specialists, as below, who ultimately agreed to admit to medicine to facilitate biopsy to expedite work-up.  Clinical Course as of 12/11/21 2100  Mon Dec 11, 2021  2040 I consult with Dr. Tasia Catchings, she would like him admitted for biopsy to help with workup [DS]  2054 I consult with Dr. Sidney Ace who requests I reach out to GI before admission [DS]  2054 I consult with Dr. Vicente Males, GI, who agrees there is nothing for GI to do. Probably would be IR performing biopsy on a liver met. No reason for transfer. 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 °  °Smith, Dylan, MD °12/11/21 2101 ° °

## 2021-12-11 NOTE — ED Provider Triage Note (Signed)
Emergency Medicine Provider Triage Evaluation Note  Ivan Pope , a 71 y.o. male  was evaluated in triage.  Pt complains of abnormal labs. Sent to the ER by oncologist. He is unsure what all was elevated. He denies pain, nausea, vomiting, diarrhea.  Review of Systems  Positive: No complaints Negative:   Physical Exam  There were no vitals taken for this visit. Gen:   Awake, no distress   Resp:  Normal effort  MSK:   Moves extremities without difficulty  Other:    Medical Decision Making  Medically screening exam initiated at 4:19 PM.  Appropriate orders placed.  Ivan Pope was informed that the remainder of the evaluation will be completed by another provider, this initial triage assessment does not replace that evaluation, and the importance of remaining in the ED until their evaluation is complete.    Victorino Dike, FNP 12/11/21 1858

## 2021-12-11 NOTE — Telephone Encounter (Signed)
Radiology called with results for this patient. [facility] Radiology.

## 2021-12-11 NOTE — H&P (Addendum)
[place]   PATIENT NAME: Ivan Pope    MR#:  [id number]  DATE OF BIRTH:  10-27-1951  DATE OF ADMISSION:  12/11/2021  PRIMARY CARE PHYSICIAN: Kirk Ruths, MD   Patient is coming from: Home  REQUESTING/REFERRING PHYSICIAN: Vladimir Crofts, MD  CHIEF COMPLAINT:   Chief Complaint  Patient presents with   Abnormal Lab    HISTORY OF PRESENT ILLNESS:  Ivan Pope is a 71 y.o. male with medical history significant for gout, hypertension, and cholelithiasis, who presented to the emergency room for further evaluation of abnormal labs.  The patient has been having recent fatigue all the time and generalized weakness with diminished appetite and decreased urine output as well as jaundice.  He was evaluated by PCP who referred him to medical oncology.  He had labs today revealing hyponatremia hypochloremia as well as significant elevated total bili of 19.8 with direct bili 14.3 and indirect 5.5.  Alk phos was 225 and albumin 2.7 and negative hepatitis markers.  On 124 when he had a right upper quadrant ultrasound that revealed nodular surface of the liver with cirrhosis and numerous heterogenous echoic masses consistent with metastatic disease as well as contracted gallbladder that is impacted with gallstones and mild gallbladder wall thickening likely secondary to contraction with negative sonographic Murphy sign and no biliary ductal dilatation.  MRI of the abdomen with and without contrast revealed the following: 1. There is a large mass arising off the distal tail of pancreas invading the splenic hilum and invading the capsule overlying the upper pole of the left kidney. This is highly concerning for primary pancreatic adenocarcinoma. 2. Innumerable, peripherally enhancing lesions scattered throughout both lobes of liver compatible with liver metastases. 3. Mild intrahepatic bile duct dilatation secondary to mass effect from central liver metastases. No common bile duct  dilatation identified. 4. Right adrenal gland adenoma.  The patient denies any nausea or vomiting or abdominal pain.  No fever or chills.  No chest pain or dyspnea or cough or wheezing.  No dysuria, urinary frequency or urgency or flank pain. ED Course: When he came to the ER, BP was 109/80 with a temperature of 96.5 and later 98.1 with otherwise normal vital signs.  Labs: As above.  CBC was within normal.  Ferritin was 797 and TIBC 238 with iron of 101.  I added coagulation profile and and PT was normal at 13.1 with INR of 1 and PTT of 25. Imaging: As above.  Contact was made with Dr. Tasia Catchings who recommended admission.  The patient will be admitted to a medical telemetry for further evaluation and management. PAST MEDICAL HISTORY:   Past Medical History:  Diagnosis Date   Adenomatous polyp 2014   Elevated bilirubin    Erectile dysfunction    Fatigue    Gallstones    Gout    Hypertension    Liver cancer ([place]) 2023   suspected   Prediabetes    Snoring     PAST SURGICAL HISTORY:   Past Surgical History:  Procedure Laterality Date   COLONOSCOPY  2014   Dr. Darrold Span @ [place], rpt 5 yrs per [facility]   MOUTH SURGERY      SOCIAL HISTORY:   Social History   Tobacco Use   Smoking status: Never    Passive exposure: Never   Smokeless tobacco: Never  Substance Use Topics   Alcohol use: Not Currently    FAMILY HISTORY:   Family History  Problem Relation Age of Onset   Lung cancer Mother    Hypertension Daughter     DRUG ALLERGIES:  No Known Allergies  REVIEW OF SYSTEMS:   ROS As per history of present illness. All pertinent systems were reviewed above. Constitutional, HEENT, cardiovascular, respiratory, GI, GU, musculoskeletal, neuro, psychiatric, endocrine, integumentary and hematologic systems were reviewed and are otherwise negative/unremarkable except for positive findings mentioned above in the HPI.   MEDICATIONS AT HOME:   Prior to Admission medications    Medication Sig Start Date End Date Taking? Authorizing Provider  allopurinol (ZYLOPRIM) 100 MG tablet TAKE 1 TABLET BY MOUTH ONCE DAILY (TAKE  WITH  300MG  TABLET  DAILY  FOR  A  TOTAL  DAILY  DOSE  OF  400MG) 04/04/21  Yes [provider]  allopurinol (ZYLOPRIM) 300 MG tablet TAKE 1 TABLET BY MOUTH ONCE DAILY (TAKE ALONG WITH 100MG TABLET) 10/10/21  Yes [provider]  carvedilol (COREG) 25 MG tablet Take 50 mg by mouth 2 (two) times daily. 12/14/20  Yes [provider]  lisinopril (ZESTRIL) 40 MG tablet Take 40 mg by mouth daily. 12/14/20  Yes [provider]  spironolactone (ALDACTONE) 25 MG tablet Take 25 mg by mouth daily. 10/29/21  Yes [provider]  tadalafil (CIALIS) 5 MG tablet Take 5 mg by mouth daily. 08/01/21  Yes [provider]      VITAL SIGNS:  Blood pressure (!) 123/92, pulse 70, temperature 98.1 F (36.7 C), temperature source Oral, resp. rate 18, height 5' 11"  (1.803 m), weight 95.7 kg, SpO2 99 %.  PHYSICAL EXAMINATION:  Physical Exam  GENERAL:  71 y.o.-year-old African-American male patient lying in the bed with no acute distress.  EYES: Pupils equal, round, reactive to light and accommodation. No scleral icterus. Extraocular muscles intact.  HEENT: Head atraumatic, normocephalic. Oropharynx and nasopharynx clear.  NECK:  Supple, no jugular venous distention. No thyroid enlargement, no tenderness.  LUNGS: Normal breath sounds bilaterally, no wheezing, rales,rhonchi or crepitation. No use of accessory muscles of respiration.  CARDIOVASCULAR: Regular rate and rhythm, S1, S2 normal. No murmurs, rubs, or gallops.  ABDOMEN: Soft, nontender and distended. Bowel sounds present. No organomegaly or mass.  EXTREMITIES: No pedal edema, cyanosis, or clubbing.  NEUROLOGIC: Cranial nerves II through XII are intact. Muscle strength 5/5 in all extremities. Sensation intact. Gait not checked.  PSYCHIATRIC: The patient is alert and  oriented x 3.  Normal affect and good eye contact. SKIN: No obvious rash, lesion, or ulcer.   LABORATORY PANEL:   CBC Recent Labs  Lab 12/11/21 1158  WBC 7.8  HGB 15.7  HCT 42.1  PLT 157   ------------------------------------------------------------------------------------------------------------------  Chemistries  Recent Labs  Lab 12/11/21 1158  NA 130*  K 4.5  CL 96*  CO2 26  GLUCOSE 142*  BUN 20  CREATININE 1.13  CALCIUM 8.6*  AST 37  ALT 35  ALKPHOS 225*  BILITOT 19.8*   ------------------------------------------------------------------------------------------------------------------  Cardiac Enzymes No results for input(s): TROPONINI in the last 168 hours. ------------------------------------------------------------------------------------------------------------------  RADIOLOGY:  MR Abdomen W Wo Contrast  Result Date: 12/11/2021 CLINICAL DATA:  Evaluate liver lesions identified on recent CT. EXAM: MRI ABDOMEN WITHOUT AND WITH CONTRAST TECHNIQUE: Multiplanar multisequence MR imaging of the abdomen was performed both before and after the administration of intravenous contrast. CONTRAST:  53m GADAVIST GADOBUTROL 1 MMOL/ML IV SOLN COMPARISON:  12/05/2021 FINDINGS: Lower chest: Trace left pleural effusion Hepatobiliary: There are innumerable, peripherally enhancing, T2 hyperintense and T1 hypointense lesions  scattered throughout both lobes of liver compatible with liver metastases. Lesions are too numerous to count, including: Index lesion within segment 2 measures 2.9 x 2.4 cm, image 26/5. Index lesion within segment 3 measures 2.6 x 2.2 cm, image 42/15. Index lesion within segment 6 measures 2.1 x 2.2 cm, image 53/15. Index lesion within segment 7/8 measures 2.8 x 1.9 cm, image 23/15. Index lesion central aspect of the liver there is a 2.7 by 1.9 cm, image 40/17. This results in central intrahepatic biliary dilatation with mild dilatation of the peripheral biliary  radicles in the right hepatic lobe. Gallbladder appears collapsed with a stone identified within the fundus. The CBD has a normal caliber. There is dilatation of the peripheral biliary radicles within both lobes of liver likely reflecting mass effect from the diffuse liver metastases. Pancreas: Mass arising off the distal tail of pancreas invades the splenic hilum. The tumor also extends to involve the capsule overlying the upper pole of the left kidney, image 26/19. This measures 3.7 by 3.8 cm, image 45/13 and is highly concerning for primary pancreatic adenocarcinoma. No signs of pancreatic inflammation or main duct dilatation. Spleen: Tail of pancreas mass is seen invading the splenic hilum, image 24/19. Adrenals/Urinary Tract: There is a peripherally enhancing nodule involving the right adrenal gland measuring 1.6 cm, image 39/20. There is loss of signal within this nodule between the inphase and out of phase sequences compatible with a benign adenoma. Hypertrophy of the left adrenal gland noted without discrete mass. Multiple small bilateral kidney cysts are identified, which are technically too small to reliably characterize. No signs of hydronephrosis or mass. Stomach/Bowel: Visualized portions within the abdomen are unremarkable. Vascular/Lymphatic: No pathologically enlarged lymph nodes identified. No abdominal aortic aneurysm demonstrated. Aortic atherosclerosis. Other:  No free fluid or fluid collections identified. Musculoskeletal: No suspicious bone lesions identified. IMPRESSION: 1. There is a large mass arising off the distal tail of pancreas invading the splenic hilum and invading the capsule overlying the upper pole of the left kidney. This is highly concerning for primary pancreatic adenocarcinoma. 2. Innumerable, peripherally enhancing lesions scattered throughout both lobes of liver compatible with liver metastases. 3. Mild intrahepatic bile duct dilatation secondary to mass effect from central  liver metastases. No common bile duct dilatation identified. 4. Right adrenal gland adenoma. These results will be called to the ordering clinician or representative by the Radiologist Assistant, and communication documented in the PACS or [employer]. Electronically Signed   By: Kerby Moors M.D.   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  Suspected primary pancreatic carcinoma with metastasis to the liver with subsequent cholestatic obstructive jaundice without common bile duct dilatation and elevated LFTs. - The patient will be admitted to a medical telemetry bed. - We will obtain an ultrasound-guided biopsy by IR in AM. - Oncology follow-up consult to be obtained. - Dr. Tasia Catchings is aware about the patient. - We will defer GI further evaluation to Dr. Tasia Catchings.  2.  Hyponatremia. - We will hydrate with IV normal saline and follow sodium level.  3.  Essential hypertension. - We will continue Coreg and lisinopril.  4.  Gout. - We will continue allopurinol.  DVT prophylaxis: Lovenox.  Code Status: full code.   Family Communication:  The plan of care was discussed in details with the patient (and family). I answered all questions. The patient agreed to proceed with the above  mentioned plan. Further management will depend upon hospital course. Disposition Plan: Back to previous home environment Consults called: Oncology consult.  All the records are reviewed and case discussed with ED provider.  Status is: Inpatient  At the time of the admission, it appears that the appropriate admission status for this patient is inpatient.  This is judged to be reasonable and necessary in order to provide the required intensity of service to ensure the patient's safety given the presenting symptoms, physical exam findings and initial radiographic and laboratory data in the context of comorbid conditions.  The patient requires  inpatient status due to high intensity of service, high risk of further deterioration and high frequency of surveillance required.  I certify that at the time of admission, it is my clinical judgment that the patient will require inpatient hospital care extending more than 2 midnights.                            Dispo: The patient is from: Home              Anticipated d/c is to: Home              Patient currently is not medically stable to d/c.              Difficult to place patient: No  Christel Mormon M.D on 12/11/2021 at 9:54 PM  [facility]   From 7 PM-7 AM, contact night-coverage [email or web address]  CC: Primary care physician; Kirk Ruths, MD

## 2021-12-11 NOTE — ED Triage Notes (Signed)
Pt via POV from home. Pt has an abnormal lab. Total bilirubin was 19.3. Pt labs and MRI were done today. Denies today. Jaundice noticed to his eyes. Pt is A&Ox4 and NAD

## 2021-12-11 NOTE — Telephone Encounter (Signed)
Called patient to inform him that he need to go to ER per Dr. Tasia Catchings, this for elevated T bili and pancreatic mass. Pt need further work up, Pt verbalized understanding.

## 2021-12-12 ENCOUNTER — Inpatient Hospital Stay: Payer: Medicare HMO

## 2021-12-12 ENCOUNTER — Encounter: Payer: Self-pay | Admitting: Family Medicine

## 2021-12-12 DIAGNOSIS — K8689 Other specified diseases of pancreas: Secondary | ICD-10-CM

## 2021-12-12 DIAGNOSIS — C787 Secondary malignant neoplasm of liver and intrahepatic bile duct: Secondary | ICD-10-CM | POA: Diagnosis present

## 2021-12-12 DIAGNOSIS — E871 Hypo-osmolality and hyponatremia: Secondary | ICD-10-CM | POA: Diagnosis present

## 2021-12-12 DIAGNOSIS — I10 Essential (primary) hypertension: Secondary | ICD-10-CM | POA: Diagnosis present

## 2021-12-12 DIAGNOSIS — M109 Gout, unspecified: Secondary | ICD-10-CM | POA: Diagnosis present

## 2021-12-12 DIAGNOSIS — I4891 Unspecified atrial fibrillation: Secondary | ICD-10-CM | POA: Clinically undetermined

## 2021-12-12 DIAGNOSIS — I129 Hypertensive chronic kidney disease with stage 1 through stage 4 chronic kidney disease, or unspecified chronic kidney disease: Secondary | ICD-10-CM | POA: Diagnosis present

## 2021-12-12 LAB — CBC
HCT: 38.5 % — ABNORMAL LOW (ref 39.0–52.0)
Hemoglobin: 14.1 g/dL (ref 13.0–17.0)
MCH: 29.5 pg (ref 26.0–34.0)
MCHC: 36.6 g/dL — ABNORMAL HIGH (ref 30.0–36.0)
MCV: 80.5 fL (ref 80.0–100.0)
Platelets: 170 10*3/uL (ref 150–400)
RBC: 4.78 MIL/uL (ref 4.22–5.81)
RDW: 16.9 % — ABNORMAL HIGH (ref 11.5–15.5)
WBC: 8 10*3/uL (ref 4.0–10.5)
nRBC: 0 % (ref 0.0–0.2)

## 2021-12-12 LAB — COMPREHENSIVE METABOLIC PANEL
ALT: 33 U/L (ref 0–44)
AST: 35 U/L (ref 15–41)
Albumin: 2.5 g/dL — ABNORMAL LOW (ref 3.5–5.0)
Alkaline Phosphatase: 214 U/L — ABNORMAL HIGH (ref 38–126)
Anion gap: 8 (ref 5–15)
BUN: 25 mg/dL — ABNORMAL HIGH (ref 8–23)
CO2: 25 mmol/L (ref 22–32)
Calcium: 8.7 mg/dL — ABNORMAL LOW (ref 8.9–10.3)
Chloride: 98 mmol/L (ref 98–111)
Creatinine, Ser: 1.17 mg/dL (ref 0.61–1.24)
GFR, Estimated: >60 mL/min (ref >60)
Glucose, Bld: 116 mg/dL — ABNORMAL HIGH (ref 70–99)
Potassium: 4.4 mmol/L (ref 3.5–5.1)
Sodium: 131 mmol/L — ABNORMAL LOW (ref 135–145)
Total Bilirubin: 19.8 mg/dL (ref 0.3–1.2)
Total Protein: 6.9 g/dL (ref 6.5–8.1)

## 2021-12-12 LAB — CANCER ANTIGEN 19-9: CA 19-9: 204 U/mL — ABNORMAL HIGH (ref 0–35)

## 2021-12-12 LAB — AFP TUMOR MARKER: AFP, Serum, Tumor Marker: 2.4 ng/mL (ref 0.0–8.4)

## 2021-12-12 LAB — CEA: CEA: 227 ng/mL — ABNORMAL HIGH (ref 0.0–4.7)

## 2021-12-12 MED ORDER — TRAZODONE HCL 50 MG PO TABS
50.0000 mg | ORAL_TABLET | Freq: Every evening | ORAL | Status: DC | PRN
  Administered 2021-12-12 – 2021-12-13 (×2): 50 mg via ORAL
  Filled 2021-12-12 (×2): qty 1

## 2021-12-12 MED ORDER — MIDAZOLAM HCL 5 MG/5ML IJ SOLN
INTRAMUSCULAR | Status: AC | PRN
  Administered 2021-12-12: 1 mg via INTRAVENOUS

## 2021-12-12 MED ORDER — MIDAZOLAM HCL 2 MG/2ML IJ SOLN
INTRAMUSCULAR | Status: AC
  Filled 2021-12-12: qty 2

## 2021-12-12 MED ORDER — FENTANYL CITRATE (PF) 100 MCG/2ML IJ SOLN
INTRAMUSCULAR | Status: AC
  Filled 2021-12-12: qty 2

## 2021-12-12 MED ORDER — ENOXAPARIN SODIUM 40 MG/0.4ML IJ SOSY
40.0000 mg | PREFILLED_SYRINGE | INTRAMUSCULAR | Status: DC
  Administered 2021-12-13: 10:00:00 40 mg via SUBCUTANEOUS
  Filled 2021-12-12: qty 0.4

## 2021-12-12 MED ORDER — ALPRAZOLAM 0.5 MG PO TABS
0.5000 mg | ORAL_TABLET | Freq: Two times a day (BID) | ORAL | Status: DC | PRN
  Administered 2021-12-12 – 2021-12-13 (×3): 0.5 mg via ORAL
  Filled 2021-12-12 (×3): qty 1

## 2021-12-12 MED ORDER — FENTANYL CITRATE (PF) 100 MCG/2ML IJ SOLN
INTRAMUSCULAR | Status: AC | PRN
  Administered 2021-12-12: 50 ug via INTRAVENOUS

## 2021-12-12 NOTE — Assessment & Plan Note (Addendum)
Suspect pancreatic carcinoma.   Biopsy of liver lesion was obtained today 1/31.  Patient presented with abnormal labs, fatigue, weakness, poor PO intake with early satiety.  He denies abdominal pain nausea or vomiting. -- Follow-up pathology -- Oncology is following, see their recommendations

## 2021-12-12 NOTE — Progress Notes (Addendum)
Set patient up on telemetry and the telemetry tech advised that patient was in A-Fib which is new for the patient. Made Dr. Heloise Beecham aware of same, no new orders at this time.    Addendum by Dr. Arbutus Ped: cardiology has been consulted and EKG ordered.  Pending.

## 2021-12-12 NOTE — Hospital Course (Signed)
71 y.o. male with medical history significant for gout, hypertension, and cholelithiasis, who presented to the ED on 12/11/3021 for further evaluation of abnormal labs with total bilirubin 19.8, direct bili 14.3, Alk phos 225.  Pt reported recent fatigue all the time, generalized weakness, diminished appetite with early satiety, jaundice and  decreased urine output.  He was seen by PCP and referred to medical oncology.    MRI of the abdomen on 1/30 obtained, showing 1) a large mass arising off the distal tail of pancreas invading the splenic hilum and invading the capsule overlying the upper pole of the left kidney. This is highly concerning for primary pancreatic adenocarcinoma; 2) innumerable, peripherally enhancing lesions scattered throughout both lobes of liver compatible with liver metastases; 3) mild intrahepatic bile duct dilatation secondary to mass effect from central liver metastases. No common bile duct dilatation identified. 4) Right adrenal gland adenoma.  Admitted to medicine service.  Oncology consulted, recommended biopsy.  IR was consulted and performed U/S guided liver biopsy on 1/31.

## 2021-12-12 NOTE — Assessment & Plan Note (Signed)
Suspect due to underlying pancreatic cancer.

## 2021-12-12 NOTE — Assessment & Plan Note (Addendum)
On arrival to the floor today, RN reported patient in A. fib on telemetry.  No known prior history.  Heart rate is controlled. -- Cardiology consulted, see their recommendations -- EKG pending -- Patient already on Coreg, continue -- Telemetry monitoring

## 2021-12-12 NOTE — Consult Note (Addendum)
°[facility] CONSULT NOTE  ° °    °Patient ID: °Ivan Pope °MRN: [id number] °DOB/AGE: 05/19/1951 71 y.o. ° °Admit date: 12/11/2021 °Referring Physician Dr. Kelly Griffith °Primary Physician Dr. Marshall anderson °Primary Cardiologist possibly Dr. Callwood °Reason for Consultation new onset atrial fibrillation ° °HPI: The patient is a 71-year-old male with a past medical history significant for hypertension, CKD3, gout, cholelithiasis, prediabetes who presented to [facility] ED the evening of 1/30 for further evaluation of abnormal labs -specifically hyponatremia hypochloremia and elevated T bili of 19.8.  The patient reported recent fatigue, generalized weakness, diminished appetite and decreased urine output.  Recent right upper quadrant ultrasound revealed numerous heterogenous echoic masses consistent with suspected metastatic disease.  Abdominal MRI revealed large mass arising off the distal tail of the pancreas highly concerning for primary pancreatic adenocarcinoma.  Cardiology is consulted for new onset atrial fibrillation. ° °The patient presented to [facility] walk[facility] 12/01/2021 with shortness of breath and elevated blood pressure and was sent directly over to the ED for EKG labs and a chest x-ray.  EKG at that time revealed atrial flutter with rate of 75 bpm.  Repeat EKG today reveals A. fib with some flutter waves and rate of 78.  Telemetry shows atrial fibrillation with occasional PVCs rate mostly controlled at 70. ° °Today the patient admits to feeling tired and "just not feeling right" with increased shortness of breath for the past couple weeks.  States his urine has been very dark and noticed some yellowing of his eyes lately too.  Denies chest pain, palpitations, dizziness, orthopnea.  Patient thinks he saw Dr. Callwood around 20 years ago for his high blood pressure and does not ever remember having a stress test or any other cardiac procedures done at that time.  Denies history of  heart attack, stroke, stent placement. ° °Vitals are significant for blood pressure of 113/86 with heart rate controlled at 71. ° °Labs on admission are significant for sodium 131, creatinine 1.17, EGFR greater than 60.  T bili 19.8 alk phos 214.  H&H 14/38.5.  Platelets 170.  CA 19-9 elevated at 204, CEA elevated to 227. INR 1.0.  ° °Review of systems complete and found to be negative unless listed above  ° ° ° °Past Medical History:  °Diagnosis Date  ° Adenomatous polyp 2014  ° Elevated bilirubin   ° Erectile dysfunction   ° Fatigue   ° Gallstones   ° Gout   ° Hypertension   ° Liver cancer (HCC) 2023  ° suspected  ° Prediabetes   ° Snoring   °  °Past Surgical History:  °Procedure Laterality Date  ° COLONOSCOPY  2014  ° Dr. P. Oh @ [facility] - Adenomatous Polyp, rpt 5 yrs per PYO  ° MOUTH SURGERY    °  °Medications Prior to Admission  °Medication Sig Dispense Refill Last Dose  ° allopurinol (ZYLOPRIM) 100 MG tablet TAKE 1 TABLET BY MOUTH ONCE DAILY (TAKE  WITH  300MG  TABLET  DAILY  FOR  A  TOTAL  DAILY  DOSE  OF  400MG)   12/11/2021  ° allopurinol (ZYLOPRIM) 300 MG tablet TAKE 1 TABLET BY MOUTH ONCE DAILY (TAKE ALONG WITH 100MG TABLET)   12/11/2021  ° carvedilol (COREG) 25 MG tablet Take 50 mg by mouth 2 (two) times daily.   12/11/2021  ° lisinopril (ZESTRIL) 40 MG tablet Take 40 mg by mouth daily.   12/11/2021  ° spironolactone (ALDACTONE) 25 MG tablet Take 25 mg by mouth   mouth daily.   12/11/2021   tadalafil (CIALIS) 5 MG tablet Take 5 mg by mouth daily.   12/11/2021   Social History   Socioeconomic History   Marital status: Single    Spouse name: Not on file   Number of children: Not on file   Years of education: Not on file   Highest education level: Not on file  Occupational History   Not on file  Tobacco Use   Smoking status: Never    Passive exposure: Never   Smokeless tobacco: Never  Vaping Use   Vaping Use: Never used  Substance and Sexual Activity   Alcohol use: Not Currently   Drug use: Not on file    Sexual activity: Not on file  Other Topics Concern   Not on file  Social History Narrative   Not on file   Social Determinants of Health   Financial Resource Strain: Not on file  Food Insecurity: Not on file  Transportation Needs: Not on file  Physical Activity: Not on file  Stress: Not on file  Social Connections: Not on file  Intimate Partner Violence: Not on file    Family History  Problem Relation Age of Onset   Lung cancer Mother    Hypertension Daughter       Review of systems complete and found to be negative unless listed above    PHYSICAL EXAM General: Elderly black male, well nourished, in no acute distress.  Lying in incline in hospital bed with daughter and brother at bedside. HEENT:  Normocephalic and atraumatic.  Sclera icteric bilaterally Neck:  No JVD.  Lungs: Normal respiratory effort on room air.  Poor air movement.  Clear bilaterally to auscultation. No wheezes, crackles, rhonchi.  Heart: Irregularly irregular rhythm with controlled rate. Normal S1 and S2 without gallops or murmurs. Radial & DP pulses 2+ bilaterally. Abdomen: Distended appearing Msk: Normal strength and tone for age. Extremities: Warm and well perfused. No clubbing, cyanosis.  Trace bilateral lower extremity edema.  Neuro: Alert and oriented X 3. Psych:  Answers questions appropriately.   Labs:   Lab Results  Component Value Date   WBC 8.0 12/12/2021   HGB 14.1 12/12/2021   HCT 38.5 (L) 12/12/2021   MCV 80.5 12/12/2021   PLT 170 12/12/2021    Recent Labs  Lab 12/12/21 0534  NA 131*  K 4.4  CL 98  CO2 25  BUN 25*  CREATININE 1.17  CALCIUM 8.7*  PROT 6.9  BILITOT 19.8*  ALKPHOS 214*  ALT 33  AST 35  GLUCOSE 116*   No results found for: CKTOTAL, CKMB, CKMBINDEX, TROPONINI No results found for: CHOL No results found for: HDL No results found for: LDLCALC No results found for: TRIG No results found for: CHOLHDL No results found for: LDLDIRECT    Radiology: DG  Chest 2 View  Result Date: 12/01/2021 CLINICAL DATA:  Shortness of breath with exertion. EXAM: CHEST - 2 VIEW COMPARISON:  None. FINDINGS: The lungs are clear without focal pneumonia, edema, pneumothorax or pleural effusion. Cardiopericardial silhouette is at upper limits of normal for size. There is pulmonary vascular congestion without overt pulmonary edema. Streaky opacity at the left base suggest atelectasis. The visualized bony structures of the thorax show no acute abnormality. IMPRESSION: Pulmonary vascular congestion without overt pulmonary edema. Electronically Signed   By: Misty Stanley M.D.   On: 12/01/2021 15:08   CT Angio Chest Pulmonary Embolism (PE) W or WO Contrast  Result Date: 12/05/2021 CLINICAL DATA:  Shortness of breath. EXAM: CT ANGIOGRAPHY CHEST WITH CONTRAST TECHNIQUE: Multidetector CT imaging of the chest was performed using the standard protocol during bolus administration of intravenous contrast. Multiplanar CT image reconstructions and MIPs were obtained to evaluate the vascular anatomy. RADIATION DOSE REDUCTION: This exam was performed according to the departmental dose-optimization program which includes automated exposure control, adjustment of the mA and/or kV according to patient size and/or use of iterative reconstruction technique. CONTRAST:  75mL OMNIPAQUE IOHEXOL 350 MG/ML SOLN COMPARISON:  December 01, 2021. FINDINGS: Cardiovascular: Satisfactory opacification of the pulmonary arteries to the segmental level. No evidence of pulmonary embolism. Normal heart size. No pericardial effusion. Atherosclerosis of thoracic aorta is noted without aneurysm formation. Mediastinum/Nodes: No enlarged mediastinal, hilar, or axillary lymph nodes. Thyroid gland, trachea, and esophagus demonstrate no significant findings. Lungs/Pleura: No pneumothorax or pleural effusion is noted. 11 x 10 mm ill-defined sub solid density is noted in right lower lobe. Mild left posterior basilar subsegmental  atelectasis is noted. Upper Abdomen: Multiple ill-defined low densities are noted throughout hepatic parenchyma consistent with metastatic disease. Musculoskeletal: No chest wall abnormality. No acute or significant osseous findings. Review of the MIP images confirms the above findings. IMPRESSION: No definite evidence of pulmonary embolus. Multiple ill-defined hepatic low densities are noted consistent with hepatic metastatic disease. These results will be called to the ordering clinician or representative by the Radiologist Assistant, and communication documented in the PACS or zVision Dashboard. 10 x 11 mm ill-defined sub solid density is noted in right lower lobe. Initial follow-up by chest CT without contrast is recommended in 3 months to confirm persistence. This recommendation follows the consensus statement: Recommendations for the Management of Subsolid Pulmonary Nodules Detected at CT: A Statement from the Fleischner Society as published in Radiology 2013; 266:304-317. Aortic Atherosclerosis (ICD10-I70.0). Electronically Signed   By: James  Green Jr M.D.   On: 12/05/2021 10:53  ° °MR Abdomen W Wo Contrast ° °Result Date: 12/11/2021 °CLINICAL DATA:  Evaluate liver lesions identified on recent CT. EXAM: MRI ABDOMEN WITHOUT AND WITH CONTRAST TECHNIQUE: Multiplanar multisequence MR imaging of the abdomen was performed both before and after the administration of intravenous contrast. CONTRAST:  10mL GADAVIST GADOBUTROL 1 MMOL/ML IV SOLN COMPARISON:  12/05/2021 FINDINGS: Lower chest: Trace left pleural effusion Hepatobiliary: There are innumerable, peripherally enhancing, T2 hyperintense and T1 hypointense lesions scattered throughout both lobes of liver compatible with liver metastases. Lesions are too numerous to count, including: Index lesion within segment 2 measures 2.9 x 2.4 cm, image 26/5. Index lesion within segment 3 measures 2.6 x 2.2 cm, image 42/15. Index lesion within segment 6 measures 2.1 x 2.2 cm,  image 53/15. Index lesion within segment 7/8 measures 2.8 x 1.9 cm, image 23/15. Index lesion central aspect of the liver there is a 2.7 by 1.9 cm, image 40/17. This results in central intrahepatic biliary dilatation with mild dilatation of the peripheral biliary radicles in the right hepatic lobe. Gallbladder appears collapsed with a stone identified within the fundus. The CBD has a normal caliber. There is dilatation of the peripheral biliary radicles within both lobes of liver likely reflecting mass effect from the diffuse liver metastases. Pancreas: Mass arising off the distal tail of pancreas invades the splenic hilum. The tumor also extends to involve the capsule overlying the upper pole of the left kidney, image 26/19. This measures 3.7 by 3.8 cm, image 45/13 and is highly concerning for primary pancreatic adenocarcinoma. No signs of pancreatic inflammation or main duct dilatation. Spleen: Tail of   pancreas mass is seen invading the splenic hilum, image 24/19. Adrenals/Urinary Tract: There is a peripherally enhancing nodule involving the right adrenal gland measuring 1.6 cm, image 39/20. There is loss of signal within this nodule between the inphase and out of phase sequences compatible with a benign adenoma. Hypertrophy of the left adrenal gland noted without discrete mass. Multiple small bilateral kidney cysts are identified, which are technically too small to reliably characterize. No signs of hydronephrosis or mass. Stomach/Bowel: Visualized portions within the abdomen are unremarkable. Vascular/Lymphatic: No pathologically enlarged lymph nodes identified. No abdominal aortic aneurysm demonstrated. Aortic atherosclerosis. Other:  No free fluid or fluid collections identified. Musculoskeletal: No suspicious bone lesions identified. IMPRESSION: 1. There is a large mass arising off the distal tail of pancreas invading the splenic hilum and invading the capsule overlying the upper pole of the left kidney. This  is highly concerning for primary pancreatic adenocarcinoma. 2. Innumerable, peripherally enhancing lesions scattered throughout both lobes of liver compatible with liver metastases. 3. Mild intrahepatic bile duct dilatation secondary to mass effect from central liver metastases. No common bile duct dilatation identified. 4. Right adrenal gland adenoma. These results will be called to the ordering clinician or representative by the Radiologist Assistant, and communication documented in the PACS or [facility] Dashboard. Electronically Signed   By: Taylor  Stroud M.D.   On: 12/11/2021 13:16  ° °US Abdomen Limited RUQ (LIVER/GB) ° °Result Date: 12/05/2021 °CLINICAL DATA:  Elevated bilirubin EXAM: ULTRASOUND ABDOMEN LIMITED RIGHT UPPER QUADRANT COMPARISON:  CT a chest 12/05/2021 FINDINGS: Gallbladder: The gallbladder is contracted. Gallbladder appears impacted with shadowing stones. The wall appears mildly thickened measuring up to 3 mm, though this is likely due to contraction. There is no pericholecystic fluid. Sonographic Murphy's sign was reported negative. Common bile duct: Diameter: 3 mm Liver: The liver is markedly heterogeneous in echotexture. The liver surface contour is nodular. Numerous heterogeneously echoic masses are seen corresponding to the low-density lesion seen on the prior CT chest. Portal vein is patent on color Doppler imaging with normal direction of blood flow towards the liver. Other: None. IMPRESSION: 1. Nodular surface contour suggesting a background of cirrhosis. 2. Numerous heterogeneously echoic masses consistent with metastatic disease as seen on the prior CTA chest. 3. Contracted gallbladder which is impacted with gallstones. Mild gallbladder wall thickening is likely due to contraction. There is no pericholecystic fluid, and sonographic Murphy's sign was reported negative. 4. No biliary ductal dilatation. Electronically Signed   By: Peter  Noone M.D.   On: 12/05/2021 11:30   ° °ECHO no priors   ° °TELEMETRY reviewed by me: atrial fibrillation with occasional PVCs rate mostly controlled at 70. ° °EKG reviewed by me: Atrial fib versus flutter rate of 78 ° °ASSESSMENT AND PLAN:  °The patient is a 71-year-old male with a past medical history significant for hypertension, CKD3, gout, cholelithiasis, prediabetes who presented to [facility] ED the evening of 1/30 for further evaluation of abnormal labs -specifically hyponatremia hypochloremia and elevated T bili of 19.8.  The patient reported recent fatigue, generalized weakness, diminished appetite and decreased urine output.  Recent right upper quadrant ultrasound revealed numerous heterogenous echoic masses consistent with suspected metastatic disease.  Abdominal MRI revealed large mass arising off the distal tail of the pancreas highly concerning for primary pancreatic adenocarcinoma.  Cardiology is consulted for new onset atrial fibrillation. ° °#New onset atrial fibrillation/flutter °#Pancreatic mass on abdominal MRI w/ liver masses °EKG from 12/01/2021 in the ED revealed atrial flutter with controlled response at   75, repeat EKG today confirms the same.  Patient denies significant cardiac history other than hypertension.  Denies palpitations or heart fluttering. °-Continue current medications as below.  Currently rate controlled on Coreg 50 mg twice daily. °-ordered echocardiogram complete.  °-CHA2DS2-VASc is 2 (age, hypertension). Can consider anticoagulation for stroke risk reduction, but with his suspicious masses seen on imaging concerning metastatic malignancy the risk of bleeding likely outweighs the benefit of anticoagulation at this time. °-He can follow-up in office 1 to 2 weeks after discharge with Dr. Orgel. ° °#Hypertension °-Blood pressure well controlled at 118/86 today. °-Continue home meds which include Coreg 50mg twice daily, lisinopril 40 mg once daily, spironolactone 25.  ° °#CKD 3 °Current creatinine 1.17, EGFR greater than 60. Improved from  06/2021 at 1.6 and 52.  ° °Cardiology signing off. Please contact myself or Dr. Orgel with questions if needed.  ° °This patient's plan of care was discussed and created with Dr. Ryan Orgel and he is in agreement. ° °Signed: °Lily Michelle Tang , PA-C °12/12/2021, 2:45 PM °[facility] °Please [place] 7a-4p M-F.  ° ° °  °

## 2021-12-12 NOTE — ED Notes (Signed)
Pt eating breakfast at this time.  

## 2021-12-12 NOTE — Assessment & Plan Note (Signed)
Without acute flare symptoms at this time.  Continue allopurinol.

## 2021-12-12 NOTE — Assessment & Plan Note (Signed)
Blood pressure currently controlled, soft at times.  Continued on home regimen of Coreg, spironolactone and lisinopril. -- Monitor BP -- Hold antihypertensives if SBP <110 or DBP < 60

## 2021-12-12 NOTE — Consult Note (Signed)
Lucilla Lame, MD [facility]  [address] [place], [place] [address] Phone: [phone] Fax : [phone]  Consultation  Referring Provider:     Dr. Sidney Ace Primary Care Physician:  Kirk Ruths, MD Primary Gastroenterologist:  Dr. Alice Reichert         Reason for Consultation:     Pancreatic mass consideration for stenting  Date of Admission:  12/11/2021 Date of Consultation:  12/12/2021         HPI:   Ivan Pope is a 71 y.o. male Who reports that he went to his primary care provider with fatigue.  The patient then had imaging that showed him to have what appeared to be metastatic liver disease.  The patient underwent an MRI yesterday that showed:  IMPRESSION: 1. There is a large mass arising off the distal tail of pancreas invading the splenic hilum and invading the capsule overlying the upper pole of the left kidney. This is highly concerning for primary pancreatic adenocarcinoma. 2. Innumerable, peripherally enhancing lesions scattered throughout both lobes of liver compatible with liver metastases. 3. Mild intrahepatic bile duct dilatation secondary to mass effect from central liver metastases. No common bile duct dilatation identified. 4. Right adrenal gland adenoma.  Due to the finding of a pancreatic mass with liver metastases a GI consultation was called.  The patient underwent a liver biopsy of 1 of the lesions today.  The patient's liver enzymes showed total bilirubin to be 19.8 with the alkaline phosphatase being 214 with a normal AST and ALT. The ultrasound done on January 24 of this year showed no very ductal dilatation of the extrahepatic ducts.  Past Medical History:  Diagnosis Date   Adenomatous polyp 2014   Elevated bilirubin    Erectile dysfunction    Fatigue    Gallstones    Gout    Hypertension    Liver cancer ([place]) 2023   suspected   Prediabetes    Snoring     Past Surgical History:  Procedure Laterality Date   COLONOSCOPY  2014   Dr.  Darrold Span @ [place] - Adenomatous Polyp, rpt 5 yrs per [place]      Prior to Admission medications   Medication Sig Start Date End Date Taking? Authorizing Provider  allopurinol (ZYLOPRIM) 100 MG tablet TAKE 1 TABLET BY MOUTH ONCE DAILY (TAKE  WITH  300MG   TABLET  DAILY  FOR  A  TOTAL  DAILY  DOSE  OF  400MG ) 04/04/21  Yes [provider]  allopurinol (ZYLOPRIM) 300 MG tablet TAKE 1 TABLET BY MOUTH ONCE DAILY (TAKE ALONG WITH 100MG  TABLET) 10/10/21  Yes [provider]  carvedilol (COREG) 25 MG tablet Take 50 mg by mouth 2 (two) times daily. 12/14/20  Yes [provider]  lisinopril (ZESTRIL) 40 MG tablet Take 40 mg by mouth daily. 12/14/20  Yes [provider]  spironolactone (ALDACTONE) 25 MG tablet Take 25 mg by mouth daily. 10/29/21  Yes [provider]  tadalafil (CIALIS) 5 MG tablet Take 5 mg by mouth daily. 08/01/21  Yes [provider]    Family History  Problem Relation Age of Onset   Lung cancer Mother    Hypertension Daughter      Social History   Tobacco Use   Smoking status: Never    Passive exposure: Never   Smokeless tobacco: Never  Vaping Use   Vaping Use: Never used  Substance Use Topics   Alcohol use: Not Currently  Allergies as of 12/11/2021   (No Known Allergies)    Review of Systems:    All systems reviewed and negative except where noted in HPI.   Physical Exam:  Vital signs in last 24 hours: Temp:  [97 F (36.1 C)-98.8 F (37.1 C)] 97 F (36.1 C) (01/31 1602) Pulse Rate:  [51-79] 70 (01/31 1602) Resp:  [14-29] 18 (01/31 1602) BP: (94-135)/(74-99) 121/97 (01/31 1602) SpO2:  [93 %-100 %] 98 % (01/31 1602)   General:   Pleasant, cooperative in NAD Head:  Normocephalic and atraumatic. Eyes:   Positive icterus.   Yellow Sclera. PERRLA. Ears:  Normal auditory acuity. Neck:  Supple; no masses or thyroidomegaly Lungs: Respirations even and unlabored. Lungs clear to auscultation bilaterally.    No wheezes, crackles, or rhonchi.  Heart:  Regular rate and rhythm;  Without murmur, clicks, rubs or gallops Abdomen:  Soft, nondistended, nontender. Normal bowel sounds. No appreciable masses or hepatomegaly.  No rebound or guarding.  Rectal:  Not performed. Msk:  Symmetrical without gross deformities.    Extremities:  Without edema, cyanosis or clubbing. Neurologic:  Alert and oriented x3;  grossly normal neurologically. Skin:  Intact without significant lesions or rashes. Cervical Nodes:  No significant cervical adenopathy. Psych:  Alert and cooperative. Normal affect.  LAB RESULTS: Recent Labs    12/11/21 1158 12/12/21 0534  WBC 7.8 8.0  HGB 15.7 14.1  HCT 42.1 38.5*  PLT 157 170   BMET Recent Labs    12/11/21 1158 12/12/21 0534  NA 130* 131*  K 4.5 4.4  CL 96* 98  CO2 26 25  GLUCOSE 142* 116*  BUN 20 25*  CREATININE 1.13 1.17  CALCIUM 8.6* 8.7*   LFT Recent Labs    12/11/21 1158 12/12/21 0534  PROT 7.6 6.9  ALBUMIN 2.7* 2.5*  AST 37 35  ALT 35 33  ALKPHOS 225* 214*  BILITOT 19.8* 19.8*  BILIDIR 14.3*  --   IBILI 5.5*  --    PT/INR Recent Labs    12/11/21 2102  LABPROT 13.1  INR 1.0    STUDIES: MR Abdomen W Wo Contrast  Result Date: 12/11/2021 CLINICAL DATA:  Evaluate liver lesions identified on recent CT. EXAM: MRI ABDOMEN WITHOUT AND WITH CONTRAST TECHNIQUE: Multiplanar multisequence MR imaging of the abdomen was performed both before and after the administration of intravenous contrast. CONTRAST:  64mL GADAVIST GADOBUTROL 1 MMOL/ML IV SOLN COMPARISON:  12/05/2021 FINDINGS: Lower chest: Trace left pleural effusion Hepatobiliary: There are innumerable, peripherally enhancing, T2 hyperintense and T1 hypointense lesions scattered throughout both lobes of liver compatible with liver metastases. Lesions are too numerous to count, including: Index lesion within segment 2 measures 2.9 x 2.4 cm, image 26/5. Index lesion within segment 3 measures 2.6 x 2.2 cm,  image 42/15. Index lesion within segment 6 measures 2.1 x 2.2 cm, image 53/15. Index lesion within segment 7/8 measures 2.8 x 1.9 cm, image 23/15. Index lesion central aspect of the liver there is a 2.7 by 1.9 cm, image 40/17. This results in central intrahepatic biliary dilatation with mild dilatation of the peripheral biliary radicles in the right hepatic lobe. Gallbladder appears collapsed with a stone identified within the fundus. The CBD has a normal caliber. There is dilatation of the peripheral biliary radicles within both lobes of liver likely reflecting mass effect from the diffuse liver metastases. Pancreas: Mass arising off the distal tail of pancreas invades the splenic hilum. The tumor also extends to involve the capsule overlying the upper  pole of the left kidney, image 26/19. This measures 3.7 by 3.8 cm, image 45/13 and is highly concerning for primary pancreatic adenocarcinoma. No signs of pancreatic inflammation or main duct dilatation. Spleen: Tail of pancreas mass is seen invading the splenic hilum, image 24/19. Adrenals/Urinary Tract: There is a peripherally enhancing nodule involving the right adrenal gland measuring 1.6 cm, image 39/20. There is loss of signal within this nodule between the inphase and out of phase sequences compatible with a benign adenoma. Hypertrophy of the left adrenal gland noted without discrete mass. Multiple small bilateral kidney cysts are identified, which are technically too small to reliably characterize. No signs of hydronephrosis or mass. Stomach/Bowel: Visualized portions within the abdomen are unremarkable. Vascular/Lymphatic: No pathologically enlarged lymph nodes identified. No abdominal aortic aneurysm demonstrated. Aortic atherosclerosis. Other:  No free fluid or fluid collections identified. Musculoskeletal: No suspicious bone lesions identified. IMPRESSION: 1. There is a large mass arising off the distal tail of pancreas invading the splenic hilum and  invading the capsule overlying the upper pole of the left kidney. This is highly concerning for primary pancreatic adenocarcinoma. 2. Innumerable, peripherally enhancing lesions scattered throughout both lobes of liver compatible with liver metastases. 3. Mild intrahepatic bile duct dilatation secondary to mass effect from central liver metastases. No common bile duct dilatation identified. 4. Right adrenal gland adenoma. These results will be called to the ordering clinician or representative by the Radiologist Assistant, and communication documented in the PACS or [employer]. Electronically Signed   By: Kerby Moors M.D.   On: 12/11/2021 13:16   US BIOPSY (LIVER)  Result Date: 12/12/2021 INDICATION: 71 year old gentleman with pancreatic mass and multiple hepatic metastatic lesions presents to IR for ultrasound-guided biopsy. EXAM: Ultrasound-guided biopsy of left hepatic mass. MEDICATIONS: None. ANESTHESIA/SEDATION: Moderate (conscious) sedation was employed during this procedure. A total of Versed 1 mg and Fentanyl 50 mcg was administered intravenously. Moderate Sedation Time: 10 minutes. The patient's level of consciousness and vital signs were monitored continuously by radiology nursing throughout the procedure under my direct supervision. COMPLICATIONS: None immediate. PROCEDURE: Informed written consent was obtained from the patient after a thorough discussion of the procedural risks, benefits and alternatives. All questions were addressed. Maximal Sterile Barrier Technique was utilized including caps, mask, sterile gowns, sterile gloves, sterile drape, hand hygiene and skin antiseptic. A timeout was performed prior to the initiation of the procedure. Patient position supine on the ultrasound table. Epigastric skin prepped and draped in usual sterile fashion. Following local lidocaine administration, 17 gauge introducer needle was advanced into 1 of the left hepatic lobe lesion, and 4- 18 gauge  cores were obtained utilizing continuous ultrasound guidance. Gelfoam slurry was administered through the introducer needle at the biopsy site. Samples were sent to pathology in formalin. Needle removed and hemostasis achieved with 5 minutes of manual compression. Post procedure ultrasound images showed no evidence of significant hemorrhage. IMPRESSION: Ultrasound-guided biopsy of left hepatic lesion as above. Electronically Signed   By: Miachel Roux M.D.   On: 12/12/2021 15:45      Impression / Plan:   Assessment: Principal Problem:   Pancreatic mass Active Problems:   Hyperbilirubinemia   Unintentional weight loss   New onset atrial fibrillation (HCC)   Liver metastases ([facility])   Hyponatremia   Essential hypertension   Gout   Hypertensive kidney disease with CKD stage III ([facility])   Ivan Pope is a 71 y.o. y/o male with Metastatic pancreatic cancer seen on imaging. We will consult it for  a question whether stenting would be required in this patient without any extrahepatic ductal dilation and a mass effect on the intrahepatic ducts causing dilation of the intrahepatic ducts.  Plan:  This patient is not amenable to any endoscopic draining procedure via an ERCP.  I'm not sure if a percutaneous transhepatic drain would be possible and I would recommend contacting interventional radiology to see if this is possible.  Otherwise nothing further to do from a GI point of view and defer to hematology/oncology.  I will sign off.  Please call if any further GI concerns or questions.  We would like to thank you for the opportunity to participate in the care of Ivan Pope.   Thank you for involving me in the care of this patient.      LOS: 1 day   Lucilla Lame, MD, [facility] 12/12/2021, 4:42 PM,  Pager [phone] 7am-5pm  Check AMION for 5pm -7am coverage and on weekends   Note: This dictation was prepared with Dragon dictation along with smaller phrase technology. Any transcriptional  errors that result from this process are unintentional.

## 2021-12-12 NOTE — Assessment & Plan Note (Addendum)
Chronic.  Renal function is currently normal but appears history of CKD stage IIIa. Current creatinine is 1.17.   -- Monitor renal function

## 2021-12-12 NOTE — Assessment & Plan Note (Signed)
Likely due to intrahepatic biliary obstruction. -- Follow CMP

## 2021-12-12 NOTE — ED Notes (Signed)
Pt request meds to help sleep, states did not get any rest last night. MD notified.

## 2021-12-12 NOTE — Consult Note (Signed)
Chief Complaint: Patient was seen in consultation today for liver lesions at the request of Mansy,Jan A  Referring Physician(s): Mansy,Jan A, Dr. Tasia Catchings  Supervising Physician: Mir, Sharen Heck  Patient Status: [facility] - ED  History of Present Illness: Ivan Pope is a 71 y.o. male with no significant PMHx who presented to ED on 1/20 with complaints of shortness of breath and left without being seen. Follow-up outpatient visit with PCP and oncology a few days after with labwork done that was abnormal and elevated bilirubin, CTA imaging to evaluate for PE revealed no evidence of PE but multiple hepatic densities consistent with metastatic disease. The patient was advised to come to the ED for work up. MR done 1/30. IR received request for image guided liver lesion biopsy. The patient does also complain of unexplained weight loss. Today he states his shortness of breath has improved and denies any abdominal pain. The patient denies any current chest pain. He denies any current blood thinner use, denies any known bleeding or clotting disorder. The patient denies any history of sleep apnea or chronic oxygen use. He has no known complications to sedation.    Past Medical History:  Diagnosis Date   Adenomatous polyp 2014   Elevated bilirubin    Erectile dysfunction    Fatigue    Gallstones    Gout    Hypertension    Liver cancer ([place]) 2023   suspected   Prediabetes    Snoring     Past Surgical History:  Procedure Laterality Date   COLONOSCOPY  2014   Dr. Mamie Nick. Oh @ [place], rpt 5 yrs per [facility]   MOUTH SURGERY      Allergies: Patient has no known allergies.  Medications: Prior to Admission medications   Medication Sig Start Date End Date Taking? Authorizing Provider  allopurinol (ZYLOPRIM) 100 MG tablet TAKE 1 TABLET BY MOUTH ONCE DAILY (TAKE  WITH  300MG   TABLET  DAILY  FOR  A  TOTAL  DAILY  DOSE  OF  400MG ) 04/04/21  Yes [provider]  allopurinol  (ZYLOPRIM) 300 MG tablet TAKE 1 TABLET BY MOUTH ONCE DAILY (TAKE ALONG WITH 100MG  TABLET) 10/10/21  Yes [provider]  carvedilol (COREG) 25 MG tablet Take 50 mg by mouth 2 (two) times daily. 12/14/20  Yes [provider]  lisinopril (ZESTRIL) 40 MG tablet Take 40 mg by mouth daily. 12/14/20  Yes [provider]  spironolactone (ALDACTONE) 25 MG tablet Take 25 mg by mouth daily. 10/29/21  Yes [provider]  tadalafil (CIALIS) 5 MG tablet Take 5 mg by mouth daily. 08/01/21  Yes [provider]     Family History  Problem Relation Age of Onset   Lung cancer Mother    Hypertension Daughter     Social History   Socioeconomic History   Marital status: Single    Spouse name: Not on file   Number of children: Not on file   Years of education: Not on file   Highest education level: Not on file  Occupational History   Not on file  Tobacco Use   Smoking status: Never    Passive exposure: Never   Smokeless tobacco: Never  Vaping Use   Vaping Use: Never used  Substance and Sexual Activity   Alcohol use: Not Currently   Drug use: Not on file   Sexual activity: Not on file  Other Topics Concern   Not on file  Social History Narrative  Not on file   Social Determinants of Health   Financial Resource Strain: Not on file  Food Insecurity: Not on file  Transportation Needs: Not on file  Physical Activity: Not on file  Stress: Not on file  Social Connections: Not on file   Review of Systems: A 12 point ROS discussed and pertinent positives are indicated in the HPI above.  All other systems are negative.  Review of Systems  Vital Signs: BP 101/78 (BP Location: Left Arm)    Pulse 73    Temp 98.1 F (36.7 C) (Oral)    Resp (!) 22    Ht 5\' 11"  (1.803 m)    Wt 211 lb (95.7 kg)    SpO2 99%    BMI 29.43 kg/m   Physical Exam Constitutional:      Appearance: Normal appearance.  HENT:     Head: Normocephalic and atraumatic.  Cardiovascular:      Rate and Rhythm: Normal rate and regular rhythm.  Pulmonary:     Effort: Pulmonary effort is normal. No respiratory distress.  Skin:    General: Skin is warm and dry.  Neurological:     Mental Status: He is alert and oriented to person, place, and time.    Imaging: DG Chest 2 View  Result Date: 12/01/2021 CLINICAL DATA:  Shortness of breath with exertion. EXAM: CHEST - 2 VIEW COMPARISON:  None. FINDINGS: The lungs are clear without focal pneumonia, edema, pneumothorax or pleural effusion. Cardiopericardial silhouette is at upper limits of normal for size. There is pulmonary vascular congestion without overt pulmonary edema. Streaky opacity at the left base suggest atelectasis. The visualized bony structures of the thorax show no acute abnormality. IMPRESSION: Pulmonary vascular congestion without overt pulmonary edema. Electronically Signed   By: Misty Stanley M.D.   On: 12/01/2021 15:08   CT Angio Chest Pulmonary Embolism (PE) W or WO Contrast  Result Date: 12/05/2021 CLINICAL DATA:  Shortness of breath. EXAM: CT ANGIOGRAPHY CHEST WITH CONTRAST TECHNIQUE: Multidetector CT imaging of the chest was performed using the standard protocol during bolus administration of intravenous contrast. Multiplanar CT image reconstructions and MIPs were obtained to evaluate the vascular anatomy. RADIATION DOSE REDUCTION: This exam was performed according to the departmental dose-optimization program which includes automated exposure control, adjustment of the mA and/or kV according to patient size and/or use of iterative reconstruction technique. CONTRAST:  6mL OMNIPAQUE IOHEXOL 350 MG/ML SOLN COMPARISON:  December 01, 2021. FINDINGS: Cardiovascular: Satisfactory opacification of the pulmonary arteries to the segmental level. No evidence of pulmonary embolism. Normal heart size. No pericardial effusion. Atherosclerosis of thoracic aorta is noted without aneurysm formation. Mediastinum/Nodes: No enlarged  mediastinal, hilar, or axillary lymph nodes. Thyroid gland, trachea, and esophagus demonstrate no significant findings. Lungs/Pleura: No pneumothorax or pleural effusion is noted. 11 x 10 mm ill-defined sub solid density is noted in right lower lobe. Mild left posterior basilar subsegmental atelectasis is noted. Upper Abdomen: Multiple ill-defined low densities are noted throughout hepatic parenchyma consistent with metastatic disease. Musculoskeletal: No chest wall abnormality. No acute or significant osseous findings. Review of the MIP images confirms the above findings. IMPRESSION: No definite evidence of pulmonary embolus. Multiple ill-defined hepatic low densities are noted consistent with hepatic metastatic disease. These results will be called to the ordering clinician or representative by the Radiologist Assistant, and communication documented in the PACS or [employer] Dashboard. 10 x 11 mm ill-defined sub solid density is noted in right lower lobe. Initial follow-up by chest CT without contrast  is recommended in 3 months to confirm persistence. This recommendation follows the consensus statement: Recommendations for the Management of Subsolid Pulmonary Nodules Detected at CT: A Statement from the [place] as published in Radiology 2013; 266:304-317. Aortic Atherosclerosis (ICD10-I70.0). Electronically Signed   By: Marijo Conception M.D.   On: 12/05/2021 10:53   MR Abdomen W Wo Contrast  Result Date: 12/11/2021 CLINICAL DATA:  Evaluate liver lesions identified on recent CT. EXAM: MRI ABDOMEN WITHOUT AND WITH CONTRAST TECHNIQUE: Multiplanar multisequence MR imaging of the abdomen was performed both before and after the administration of intravenous contrast. CONTRAST:  38mL GADAVIST GADOBUTROL 1 MMOL/ML IV SOLN COMPARISON:  12/05/2021 FINDINGS: Lower chest: Trace left pleural effusion Hepatobiliary: There are innumerable, peripherally enhancing, T2 hyperintense and T1 hypointense lesions scattered  throughout both lobes of liver compatible with liver metastases. Lesions are too numerous to count, including: Index lesion within segment 2 measures 2.9 x 2.4 cm, image 26/5. Index lesion within segment 3 measures 2.6 x 2.2 cm, image 42/15. Index lesion within segment 6 measures 2.1 x 2.2 cm, image 53/15. Index lesion within segment 7/8 measures 2.8 x 1.9 cm, image 23/15. Index lesion central aspect of the liver there is a 2.7 by 1.9 cm, image 40/17. This results in central intrahepatic biliary dilatation with mild dilatation of the peripheral biliary radicles in the right hepatic lobe. Gallbladder appears collapsed with a stone identified within the fundus. The CBD has a normal caliber. There is dilatation of the peripheral biliary radicles within both lobes of liver likely reflecting mass effect from the diffuse liver metastases. Pancreas: Mass arising off the distal tail of pancreas invades the splenic hilum. The tumor also extends to involve the capsule overlying the upper pole of the left kidney, image 26/19. This measures 3.7 by 3.8 cm, image 45/13 and is highly concerning for primary pancreatic adenocarcinoma. No signs of pancreatic inflammation or main duct dilatation. Spleen: Tail of pancreas mass is seen invading the splenic hilum, image 24/19. Adrenals/Urinary Tract: There is a peripherally enhancing nodule involving the right adrenal gland measuring 1.6 cm, image 39/20. There is loss of signal within this nodule between the inphase and out of phase sequences compatible with a benign adenoma. Hypertrophy of the left adrenal gland noted without discrete mass. Multiple small bilateral kidney cysts are identified, which are technically too small to reliably characterize. No signs of hydronephrosis or mass. Stomach/Bowel: Visualized portions within the abdomen are unremarkable. Vascular/Lymphatic: No pathologically enlarged lymph nodes identified. No abdominal aortic aneurysm demonstrated. Aortic  atherosclerosis. Other:  No free fluid or fluid collections identified. Musculoskeletal: No suspicious bone lesions identified. IMPRESSION: 1. There is a large mass arising off the distal tail of pancreas invading the splenic hilum and invading the capsule overlying the upper pole of the left kidney. This is highly concerning for primary pancreatic adenocarcinoma. 2. Innumerable, peripherally enhancing lesions scattered throughout both lobes of liver compatible with liver metastases. 3. Mild intrahepatic bile duct dilatation secondary to mass effect from central liver metastases. No common bile duct dilatation identified. 4. Right adrenal gland adenoma. These results will be called to the ordering clinician or representative by the Radiologist Assistant, and communication documented in the PACS or [employer]. Electronically Signed   By: Kerby Moors M.D.   On: 12/11/2021 13:16   US Abdomen Limited RUQ (LIVER/GB)  Result Date: 12/05/2021 CLINICAL DATA:  Elevated bilirubin EXAM: ULTRASOUND ABDOMEN LIMITED RIGHT UPPER QUADRANT COMPARISON:  CT a chest 12/05/2021 FINDINGS: Gallbladder: The gallbladder is  contracted. Gallbladder appears impacted with shadowing stones. The wall appears mildly thickened measuring up to 3 mm, though this is likely due to contraction. There is no pericholecystic fluid. Sonographic Murphy's sign was reported negative. Common bile duct: Diameter: 3 mm Liver: The liver is markedly heterogeneous in echotexture. The liver surface contour is nodular. Numerous heterogeneously echoic masses are seen corresponding to the low-density lesion seen on the prior CT chest. Portal vein is patent on color Doppler imaging with normal direction of blood flow towards the liver. Other: None. IMPRESSION: 1. Nodular surface contour suggesting a background of cirrhosis. 2. Numerous heterogeneously echoic masses consistent with metastatic disease as seen on the prior CTA chest. 3. Contracted gallbladder  which is impacted with gallstones. Mild gallbladder wall thickening is likely due to contraction. There is no pericholecystic fluid, and sonographic Murphy's sign was reported negative. 4. No biliary ductal dilatation. Electronically Signed   By: Valetta Mole M.D.   On: 12/05/2021 11:30    Labs:  CBC: Recent Labs    12/01/21 1333 12/11/21 1158 12/12/21 0534  WBC 8.4 7.8 8.0  HGB 16.0 15.7 14.1  HCT 47.5 42.1 38.5*  PLT 142* 157 170    COAGS: Recent Labs    12/11/21 2102  INR 1.0  APTT 25    BMP: Recent Labs    12/01/21 1333 12/11/21 1158 12/12/21 0534  NA 132* 130* 131*  K 4.4 4.5 4.4  CL 97* 96* 98  CO2 30 26 25   GLUCOSE 219* 142* 116*  BUN 15 20 25*  CALCIUM 8.8* 8.6* 8.7*  CREATININE 1.46* 1.13 1.17  GFRNONAA 51* >60 >60    LIVER FUNCTION TESTS: Recent Labs    12/11/21 1158 12/12/21 0534  BILITOT 19.8* 19.8*  AST 37 35  ALT 35 33  ALKPHOS 225* 214*  PROT 7.6 6.9  ALBUMIN 2.7* 2.5*    Assessment and Plan: 71 year old male with no significant PMHx except HTN who presented to ED on 1/20 with complaints of shortness of breath and left without being seen. Follow-up outpatient visit with PCP and oncology a few days after with outpatient labs that were abnormal and CTA imaging to evaluate for PE. Imaging revealed no evidence of PE but multiple hepatic densities consistent with metastatic disease. The patient was advised to come to the ED for work up. MR done 1/30 with findings of large mass arising from tail of pancreas concerning for primary pancreatic adenocarcinoma, liver lesions and mild intra-hepatic bile duct dilatation secondary to mass effect, no CBD dilatation seen. IR received request for image guided liver lesion biopsy.   The patient has been NPO, no blood thinners taken, imaging, labs and vitals have been reviewed.  Risks and benefits of US guided liver lesion biopsy with moderate sedation was discussed with the patient and/or patient's family  including, but not limited to bleeding, infection, damage to adjacent structures or low yield requiring additional tests.  All of the questions were answered and there is agreement to proceed.  Consent signed and in chart.  Thank you for this interesting consult.  I greatly enjoyed meeting Muadh Creasy and look forward to participating in their care.  A copy of this report was sent to the requesting provider on this date.  Electronically Signed: Hedy Jacob, PA-C 12/12/2021, 1:37 PM   I spent a total of 20 Minutes in face to face in clinical consultation, greater than 50% of which was counseling/coordinating care for liver lesions.

## 2021-12-12 NOTE — ED Notes (Signed)
Pt ambulatory to restroom. Informed of new order status NPO

## 2021-12-12 NOTE — Assessment & Plan Note (Signed)
POA, sodium 130, mild.   Started on IV hydration given his recent poor p.o. intake.  Sodium this morning 131. -- Continue IV fluids -- Monitor BMP -- Further evaluation if not improving

## 2021-12-12 NOTE — Assessment & Plan Note (Signed)
Liver imaging has been consistent with cirrhosis.  Lesions seen are suspected metastatic from primary pancreatic cancer but primary liver source also possible. -- Ultrasound-guided liver biopsy today -- Follow-up pathology -- Oncology following -- Monitor CMP

## 2021-12-12 NOTE — Progress Notes (Signed)
Progress Note   Patient: Ivan Pope FIE:[id number] DOB: Aug 11, 1951 DOA: 12/11/2021     1 DOS: the patient was seen and examined on 12/12/2021   Brief hospital course: 71 y.o. male with medical history significant for gout, hypertension, and cholelithiasis, who presented to the ED on 12/11/3021 for further evaluation of abnormal labs with total bilirubin 19.8, direct bili 14.3, Alk phos 225.  Pt reported recent fatigue all the time, generalized weakness, diminished appetite with early satiety, jaundice and  decreased urine output.  He was seen by PCP and referred to medical oncology.    MRI of the abdomen on 1/30 obtained, showing 1) a large mass arising off the distal tail of pancreas invading the splenic hilum and invading the capsule overlying the upper pole of the left kidney. This is highly concerning for primary pancreatic adenocarcinoma; 2) innumerable, peripherally enhancing lesions scattered throughout both lobes of liver compatible with liver metastases; 3) mild intrahepatic bile duct dilatation secondary to mass effect from central liver metastases. No common bile duct dilatation identified. 4) Right adrenal gland adenoma.  Admitted to medicine service.  Oncology consulted, recommended biopsy.  IR was consulted and performed U/S guided liver biopsy on 1/31.  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   Biopsy of liver lesion was obtained today 1/31.  Patient presented with abnormal labs, fatigue, weakness, poor PO intake with early satiety.  He denies abdominal pain nausea or vomiting. -- Follow-up pathology -- Oncology is following, see their recommendations  Liver metastases [facility])- (present on admission) Liver imaging has been consistent with cirrhosis.  Lesions seen are suspected metastatic from primary pancreatic cancer but primary liver source also possible. -- Ultrasound-guided liver biopsy today -- Follow-up pathology --  Oncology following -- Monitor CMP  Hyperbilirubinemia- (present on admission) Likely due to intrahepatic biliary obstruction. -- Follow CMP  Unintentional weight loss- (present on admission) Suspect due to underlying pancreatic cancer.  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  Renal function is currently normal but appears history of CKD stage IIIa. Current creatinine is 1.17.   -- Monitor renal function  New onset atrial fibrillation ([place]) On arrival to the floor today, RN reported patient in A. fib on telemetry.  No known prior history.  Heart rate is controlled. -- Cardiology consulted, see their recommendations -- EKG pending -- Patient already on Coreg, continue -- Telemetry monitoring  Essential hypertension- (present on admission) Blood pressure currently controlled, soft at times.  Continued on home regimen of Coreg, spironolactone and lisinopril. -- Monitor BP -- Hold antihypertensives if SBP <110 or DBP < 60  Gout- (present on admission) Without acute flare symptoms at this time.  Continue allopurinol.  Hyponatremia- (present on admission) POA, sodium 130, mild.   Started on IV hydration given his recent poor p.o. intake.  Sodium this morning 131. -- Continue IV fluids -- Monitor BMP -- Further evaluation if not improving        Subjective: Patient seen in the ED this morning while holding for bed.  He reports being unable to rest overnight and very uncomfortable in the ED stretcher.  He endorses recently having early satiety every time he eats but denies having any abdominal pain nausea or vomiting.  He denies any other acute complaints at this time, states he just wants to get some rest and asked for something to help with this.  Physical Exam: Vitals:   12/12/21 1354 12/12/21 1356 12/12/21 1358 12/12/21  1417  BP: 96/80 98/80 100/75 94/74  Pulse: 75 69 72 68  Resp: _0 Temp:    98.8 F (37.1 C)   TempSrc:    Oral  SpO2: 100% 100% 100% 96%  Weight:      Height:       General exam: awake, alert, no acute distress HEENT: Icteric sclera, moist mucus membranes, hearing grossly normal  Respiratory system: CTAB, no wheezes, rales or rhonchi, normal respiratory effort. Cardiovascular system: normal S1/S2, RRR, no pedal edema.   Gastrointestinal system: Protuberant abdomen, nontender, bowel sounds present. Central nervous system: A&O x3. no gross focal neurologic deficits, normal speech Extremities: moves all, no edema, normal tone Skin: dry, intact, normal temperature Psychiatry: normal mood, congruent affect, judgement and insight appear normal   Data Reviewed:  Labs reviewed and notable for sodium 131, glucose 116, BUN 25, creatinine stable 1.17, alk phos 214, albumin 2.5, total bili unchanged at 19.8,  Family Communication: None at bedside on rounds, will attempt to call as time allows  Disposition: Status is: Inpatient Remains inpatient appropriate because: Severity of illness with ongoing evaluation of newly identified pancreatic mass and liver lesions, and adequate p.o. intake with electrolyte abnormalities on IV fluids  Planned Discharge Destination: Home           Time spent: 40 minutes  Author: Ezekiel Slocumb, DO 12/12/2021 3:00 PM  For on call review [email or web address].

## 2021-12-12 NOTE — ED Notes (Signed)
Pt asking for something to help him sleep. PRN medication will be given

## 2021-12-12 NOTE — Procedures (Signed)
Interventional Radiology Procedure Note  Procedure: US guided left liver lesion biopsy  Indication: Pancreatic and multiple hepatic masses  Findings: Please refer to procedural dictation for full description.  Complications: None  EBL: < 10 mL  Miachel Roux, MD [phone]

## 2021-12-13 ENCOUNTER — Inpatient Hospital Stay
Admit: 2021-12-13 | Discharge: 2021-12-13 | Disposition: A | Discharge status: Home / Self Care | Payer: Medicare HMO | Attending: Cardiology | Admitting: Cardiology

## 2021-12-13 LAB — COMPREHENSIVE METABOLIC PANEL
ALT: 30 U/L (ref 0–44)
AST: 36 U/L (ref 15–41)
Albumin: 2.4 g/dL — ABNORMAL LOW (ref 3.5–5.0)
Alkaline Phosphatase: 196 U/L — ABNORMAL HIGH (ref 38–126)
Anion gap: 10 (ref 5–15)
BUN: 26 mg/dL — ABNORMAL HIGH (ref 8–23)
CO2: 24 mmol/L (ref 22–32)
Calcium: 8.9 mg/dL (ref 8.9–10.3)
Chloride: 98 mmol/L (ref 98–111)
Creatinine, Ser: 1.24 mg/dL (ref 0.61–1.24)
GFR, Estimated: >60 mL/min (ref >60)
Glucose, Bld: 102 mg/dL — ABNORMAL HIGH (ref 70–99)
Potassium: 5.1 mmol/L (ref 3.5–5.1)
Sodium: 132 mmol/L — ABNORMAL LOW (ref 135–145)
Total Bilirubin: 21.1 mg/dL (ref 0.3–1.2)
Total Protein: 6.3 g/dL — ABNORMAL LOW (ref 6.5–8.1)

## 2021-12-13 LAB — ECHOCARDIOGRAM COMPLETE
AR max vel: 2 cm2
AV Area VTI: 2.18 cm2
AV Area mean vel: 2.02 cm2
AV Mean grad: 3 mmHg
AV Peak grad: 5.2 mmHg
Ao pk vel: 1.14 m/s
Area-P 1/2: 3.91 cm2
Height: 71 in
MV VTI: 2.54 cm2
S' Lateral: 4.02 cm
Weight: 3376 oz

## 2021-12-13 LAB — MAGNESIUM: Magnesium: 2 mg/dL (ref 1.7–2.4)

## 2021-12-13 MED ORDER — PERFLUTREN LIPID MICROSPHERE
1.0000 mL | INTRAVENOUS | Status: AC | PRN
  Administered 2021-12-13: 2 mL via INTRAVENOUS
  Filled 2021-12-13: qty 10

## 2021-12-13 MED ORDER — SPIRONOLACTONE 25 MG PO TABS: ORAL_TABLET | ORAL | Status: DC

## 2021-12-13 MED ORDER — TADALAFIL 5 MG PO TABS: 5.0000 mg | ORAL_TABLET | Freq: Every day | ORAL | 0 refills | Status: DC | PRN

## 2021-12-13 MED ORDER — LISINOPRIL 40 MG PO TABS: ORAL_TABLET | ORAL | Status: DC

## 2021-12-13 MED ORDER — ENSURE ENLIVE PO LIQD
237.0000 mL | Freq: Three times a day (TID) | ORAL | Status: DC
  Administered 2021-12-13 – 2021-12-14 (×3): 237 mL via ORAL

## 2021-12-13 MED ORDER — ENSURE ENLIVE PO LIQD: 237.0000 mL | Freq: Three times a day (TID) | ORAL | Status: AC

## 2021-12-13 NOTE — Progress Notes (Signed)
IR received request for biliary drain placement, discussed imaging and case with my attending, Dr. Denna Haggard and this is not amendable to drainage. Patient's elevated total bilirubin is likely due to combination of intrinsic liver disease and scattered sequestered bile ducts from the diffuse metastatic disease in the liver. The biliary ducts as a whole are not dilated and the CBD is normal in caliber, so no single obstructing lesion centrally that we can address with drain placement.   Hedy Jacob, PA-C 12/13/2021, 3:53 PM

## 2021-12-13 NOTE — TOC Initial Note (Signed)
Transition of Care [facility]) - Initial/Assessment Note    Patient Details  Name: Ivan Pope MRN: [id number] Date of Birth: [birth year]  Transition of Care [facility]) CM/SW Contact:    Pete Pelt, RN Phone Number: 12/13/2021, 3:44 PM  Clinical Narrative:     Patient lives with girlfriend, who assists with his care.  He had just visited with his PCP last Monday and fell ill following that visit and came to the hospital.  He states he is current with his medications and did not know he was ill.    Patient has no home services and no concerns about returning home at discharge, pending any outcome of this hospitalization changing his home routine or any recommendations.  TOC contact information provided, TOC to follow for needs.            Expected Discharge Plan:  (tbd) Barriers to Discharge: Continued Medical Work up   Patient Goals and CMS Choice     Choice offered to / list presented to : NA  Expected Discharge Plan and Services Expected Discharge Plan:  (tbd)   Discharge Planning Services: CM Consult Post Acute Care Choice:  (tbd) Living arrangements for the past 2 months: Single Family Home                                      Prior Living Arrangements/Services Living arrangements for the past 2 months: Single Family Home Lives with:: Self, Roommate Patient language and need for interpreter reviewed:: Yes (No interpreter required) Do you feel safe going back to the place where you live?: Yes      Need for Family Participation in Patient Care: Yes (Comment) Care giver support system in place?: Yes (comment) Current home services:  (no current home services) Criminal Activity/Legal Involvement Pertinent to Current Situation/Hospitalization: No - Comment as needed  Activities of Daily Living Home Assistive Devices/Equipment: None ADL Screening (condition at time of admission) Patient's cognitive ability adequate to safely complete daily activities?: Yes Is the  patient deaf or have difficulty hearing?: No Does the patient have difficulty seeing, even when wearing glasses/contacts?: No Does the patient have difficulty concentrating, remembering, or making decisions?: No Patient able to express need for assistance with ADLs?: Yes Does the patient have difficulty dressing or bathing?: No Independently performs ADLs?: Yes (appropriate for developmental age) Does the patient have difficulty walking or climbing stairs?: No Weakness of Legs: None Weakness of Arms/Hands: None  Permission Sought/Granted Permission sought to share information with : Case Manager Permission granted to share information with : Yes, Verbal Permission Granted              Emotional Assessment Appearance:: Appears stated age Attitude/Demeanor/Rapport: Gracious, Engaged Affect (typically observed): Pleasant, Appropriate Orientation: : Oriented to Self, Oriented to Place, Oriented to  Time, Oriented to Situation Alcohol / Substance Use: Not Applicable Psych Involvement: No (comment)  Admission diagnosis:  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[place]) [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.2] Bilirubinemia [E80.6] Liver metastasis ([place]) [C78.7] Pancreatic carcinoma metastatic to liver ([place]) [C25.9, C78.7] Patient Active Problem List   Diagnosis Date Noted   New onset atrial fibrillation ([place]) 12/12/2021   Liver metastases ([place]) 12/12/2021   Hyponatremia 12/12/2021   Essential hypertension 12/12/2021   Gout 12/12/2021   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 [place] [address] [place] [address] Phone: [phone] Fax: [phone]     Social Determinants of Health (SDOH) Interventions     Readmission Risk Interventions No flowsheet data found.

## 2021-12-13 NOTE — Progress Notes (Signed)
PROGRESS NOTE    Ivan Pope  PJS:[id number] DOB: Oct 08, 1951 DOA: 12/11/2021 PCP: Kirk Ruths, MD  114A/[id number]-AA   Assessment & Plan:   Principal Problem:   Pancreatic mass Active Problems:   Hyperbilirubinemia   Unintentional weight loss   New onset atrial fibrillation (HCC)   Liver metastases (HCC)   Hyponatremia   Essential hypertension   Gout   Hypertensive kidney disease with CKD stage III [facility])   71 y.o. male with medical history significant for gout, hypertension, and cholelithiasis, who presented to the ED on 12/11/3021 for further evaluation of abnormal labs with total bilirubin 19.8, direct bili 14.3, Alk phos 225.  Pt reported recent fatigue all the time, generalized weakness, diminished appetite with early satiety, jaundice and  decreased urine output.  He was seen by PCP and referred to medical oncology.     MRI of the abdomen on 1/30 obtained, showing 1) a large mass arising off the distal tail of pancreas invading the splenic hilum and invading the capsule overlying the upper pole of the left kidney. This is highly concerning for primary pancreatic adenocarcinoma; 2) innumerable, peripherally enhancing lesions scattered throughout both lobes of liver compatible with liver metastases; 3) mild intrahepatic bile duct dilatation secondary to mass effect from central liver metastases. No common bile duct dilatation identified. 4) Right adrenal gland adenoma.  Admitted to medicine service.  Oncology consulted, recommended biopsy.  IR was consulted and performed U/S guided liver biopsy on 1/31.   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   Biopsy of liver lesion was obtained on 1/31.  Patient presented with abnormal labs, fatigue, weakness, poor PO intake with early satiety.  He denies abdominal pain nausea or vomiting. --outpatient followup with oncology   Liver metastases [facility])- (present on admission) Liver imaging has been  consistent with cirrhosis.  Lesions seen are suspected metastatic from primary pancreatic cancer but primary liver source also possible. Biopsy of liver lesion was obtained on 1/31.   --outpatient followup with oncology   Hyperbilirubinemia- (present on admission) --combination of intrinsic liver disease and scattered sequestered bile ducts from the diffuse metastatic disease in the liver. The biliary ducts as a whole are not dilated and the CBD is normal in caliber, so no single obstructing lesion centrally that can be addressed with drain placement.    Unintentional weight loss- (present on admission) Suspect due to underlying pancreatic cancer.   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  Renal function is currently normal but appears history of CKD stage IIIa. Current creatinine is 1.17.   -- Monitor renal function   New onset atrial fibrillation (HCC) Heart rate is controlled. -- Cardiology consulted --cont coreg --hold anticoagulation for now    Essential hypertension- (present on admission) Blood pressure currently controlled, soft at times.   --cont home coreg --hold home Lisinopril and aldactone   Gout- (present on admission) Without acute flare symptoms at this time.  Continue allopurinol.   Hyponatremia- (present on admission) POA, sodium 130, mild.   Started on IV hydration given his recent poor p.o. intake.   --oral hydration now   DVT prophylaxis: Lovenox SQ Code Status: Full code  Family Communication:  Level of care: Telemetry Medical Dispo:   The patient is from: home Anticipated d/c is to: home Anticipated d/c date is: tomorrow Patient currently is medically ready to d/c.   Subjective and Interval History:  IR was consulted and didn't think biliary drain placement would  help pt's elevated bili.  Pt was ready for discharge, however, became extremely somnolent after a dose of Xanax, so unable to go home.     Objective: Vitals:   12/13/21 1121 12/13/21 1123 12/13/21 1529 12/13/21 2017  BP: 104/69 97/70 111/82 (!) 88/75  Pulse: (!) 58 78 64 70  Resp: 14 14 18 19   Temp: 97.8 F (36.6 C) 97.8 F (36.6 C) 98.6 F (37 C) 97.6 F (36.4 C)  TempSrc: Oral   Oral  SpO2: 97% 100% 97% 97%  Weight:      Height:        Intake/Output Summary (Last 24 hours) at 12/13/2021 2036 Last data filed at 12/13/2021 1011 Gross per 24 hour  Intake 180 ml  Output --  Net 180 ml   Filed Weights   12/11/21 1624  Weight: 95.7 kg    Examination:   Constitutional: NAD, very somnolent, difficult to awake CV: No cyanosis.   RESP: normal respiratory effort, on RA GI: abdomen large Extremities: No effusions, edema in BLE SKIN: warm, dry   Data Reviewed: I have personally reviewed following labs and imaging studies  CBC: Recent Labs  Lab 12/11/21 1158 12/12/21 0534  WBC 7.8 8.0  NEUTROABS 6.0  --   HGB 15.7 14.1  HCT 42.1 38.5*  MCV 79.9* 80.5  PLT 157 720   Basic Metabolic Panel: Recent Labs  Lab 12/11/21 1158 12/12/21 0534 12/13/21 0520  NA 130* 131* 132*  K 4.5 4.4 5.1  CL 96* 98 98  CO2 26 25 24   GLUCOSE 142* 116* 102*  BUN 20 25* 26*  CREATININE 1.13 1.17 1.24  CALCIUM 8.6* 8.7* 8.9  MG  --   --  2.0   GFR: Estimated Creatinine Clearance: 64.5 mL/min (by C-G formula based on SCr of 1.24 mg/dL). Liver Function Tests: Recent Labs  Lab 12/11/21 1158 12/12/21 0534 12/13/21 0520  AST 37 35 36  ALT 35 33 30  ALKPHOS 225* 214* 196*  BILITOT 19.8* 19.8* 21.1*  PROT 7.6 6.9 6.3*  ALBUMIN 2.7* 2.5* 2.4*   No results for input(s): LIPASE, AMYLASE in the last 168 hours. No results for input(s): AMMONIA in the last 168 hours. Coagulation Profile: Recent Labs  Lab 12/11/21 2102  INR 1.0   Cardiac Enzymes: No results for input(s): CKTOTAL, CKMB, CKMBINDEX, TROPONINI in the last 168 hours. BNP (last 3 results) No results for input(s): PROBNP in the last 8760  hours. HbA1C: No results for input(s): HGBA1C in the last 72 hours. CBG: No results for input(s): GLUCAP in the last 168 hours. Lipid Profile: No results for input(s): CHOL, HDL, LDLCALC, TRIG, CHOLHDL, LDLDIRECT in the last 72 hours. Thyroid Function Tests: No results for input(s): TSH, T4TOTAL, FREET4, T3FREE, THYROIDAB in the last 72 hours. Anemia Panel: Recent Labs    12/11/21 1158  FERRITIN 797*  TIBC 238*  IRON 101   Sepsis Labs: No results for input(s): PROCALCITON, LATICACIDVEN in the last 168 hours.  Recent Results (from the past 240 hour(s))  Resp Panel by RT-PCR (Flu A&B, Covid) Nasopharyngeal Swab     Status: None   Collection Time: 12/11/21  9:03 PM   Specimen: Nasopharyngeal Swab; Nasopharyngeal(NP) swabs in vial transport medium  Result Value Ref Range Status   SARS Coronavirus 2 by RT PCR NEGATIVE NEGATIVE Final    Comment: (NOTE) SARS-CoV-2 target nucleic acids are NOT DETECTED.  The SARS-CoV-2 RNA is generally detectable in upper respiratory specimens during the acute phase of infection. The  lowest concentration of SARS-CoV-2 viral copies this assay can detect is 138 copies/mL. A negative result does not preclude SARS-Cov-2 infection and should not be used as the sole basis for treatment or other patient management decisions. A negative result may occur with  improper specimen collection/handling, submission of specimen other than nasopharyngeal swab, presence of viral mutation(s) within the areas targeted by this assay, and inadequate number of viral copies(<138 copies/mL). A negative result must be combined with clinical observations, patient history, and epidemiological information. The expected result is Negative.  Fact Sheet for Patients:  EntrepreneurPulse.com.au  Fact Sheet for Healthcare Providers:  IncredibleEmployment.be  This test is no t yet approved or cleared by the [place] FDA and  has been  authorized for detection and/or diagnosis of SARS-CoV-2 by FDA under an Emergency Use Authorization (EUA). This EUA will remain  in effect (meaning this test can be used) for the duration of the COVID-19 declaration under Section 564(b)(1) of the Act, 21 U.S.C.section 360bbb-3(b)(1), unless the authorization is terminated  or revoked sooner.       Influenza A by PCR NEGATIVE NEGATIVE Final   Influenza B by PCR NEGATIVE NEGATIVE Final    Comment: (NOTE) The Xpert Xpress SARS-CoV-2/FLU/RSV plus assay is intended as an aid in the diagnosis of influenza from Nasopharyngeal swab specimens and should not be used as a sole basis for treatment. Nasal washings and aspirates are unacceptable for Xpert Xpress SARS-CoV-2/FLU/RSV testing.  Fact Sheet for Patients: EntrepreneurPulse.com.au  Fact Sheet for Healthcare Providers: IncredibleEmployment.be  This test is not yet approved or cleared by the [place] FDA and has been authorized for detection and/or diagnosis of SARS-CoV-2 by FDA under an Emergency Use Authorization (EUA). This EUA will remain in effect (meaning this test can be used) for the duration of the COVID-19 declaration under Section 564(b)(1) of the Act, 21 U.S.C. section 360bbb-3(b)(1), unless the authorization is terminated or revoked.  Performed at [facility], [address] [place], [place] [address]       Radiology Studies: US BIOPSY (LIVER)  Result Date: 12/12/2021 INDICATION: 71 year old gentleman with pancreatic mass and multiple hepatic metastatic lesions presents to IR for ultrasound-guided biopsy. EXAM: Ultrasound-guided biopsy of left hepatic mass. MEDICATIONS: None. ANESTHESIA/SEDATION: Moderate (conscious) sedation was employed during this procedure. A total of Versed 1 mg and Fentanyl 50 mcg was administered intravenously. Moderate Sedation Time: 10 minutes. The patient's level of consciousness and vital  signs were monitored continuously by radiology nursing throughout the procedure under my direct supervision. COMPLICATIONS: None immediate. PROCEDURE: Informed written consent was obtained from the patient after a thorough discussion of the procedural risks, benefits and alternatives. All questions were addressed. Maximal Sterile Barrier Technique was utilized including caps, mask, sterile gowns, sterile gloves, sterile drape, hand hygiene and skin antiseptic. A timeout was performed prior to the initiation of the procedure. Patient position supine on the ultrasound table. Epigastric skin prepped and draped in usual sterile fashion. Following local lidocaine administration, 17 gauge introducer needle was advanced into 1 of the left hepatic lobe lesion, and 4- 18 gauge cores were obtained utilizing continuous ultrasound guidance. Gelfoam slurry was administered through the introducer needle at the biopsy site. Samples were sent to pathology in formalin. Needle removed and hemostasis achieved with 5 minutes of manual compression. Post procedure ultrasound images showed no evidence of significant hemorrhage. IMPRESSION: Ultrasound-guided biopsy of left hepatic lesion as above. Electronically Signed   By: Miachel Roux M.D.   On: 12/12/2021 15:45  ECHOCARDIOGRAM COMPLETE  Result Date: 12/13/2021    ECHOCARDIOGRAM REPORT   Patient Name:   Ivan Pope Date of Exam: 12/13/2021 Medical Rec #:  [id number]       Height:       71.0 in Accession #:    [id number]      Weight:       211.0 lb Date of Birth:  07/07/1951       BSA:          2.157 m Patient Age:    50 years        BP:           99/58 mmHg Patient Gender: M               HR:           79 bpm. Exam Location:  [facility] Procedure: 2D Echo, Color Doppler, Cardiac Doppler and Intracardiac            Opacification Agent Indications:     I48.91 Atrial fibrillation  History:         Patient has no prior history of Echocardiogram examinations.                  Risk  Factors:Hypertension.  Sonographer:     Charmayne Sheer Referring Phys:  [id number] [place] TANG Diagnosing Phys: Donnelly Angelica  Sonographer Comments: Suboptimal apical window and suboptimal subcostal window. Image acquisition challenging due to patient body habitus. IMPRESSIONS  1. Left ventricular ejection fraction, by estimation, is 50 to 55%. The left ventricle has low normal function. The left ventricle has no regional wall motion abnormalities. There is mild left ventricular hypertrophy. Left ventricular diastolic parameters are indeterminate.  2. Right ventricular systolic function was not well visualized. The right ventricular size is not well visualized.  3. The mitral valve is normal in structure. No evidence of mitral valve regurgitation. No evidence of mitral stenosis.  4. The aortic valve is normal in structure. Aortic valve regurgitation is not visualized. No aortic stenosis is present. FINDINGS  Left Ventricle: Left ventricular ejection fraction, by estimation, is 50 to 55%. The left ventricle has low normal function. The left ventricle has no regional wall motion abnormalities. Definity contrast agent was given IV to delineate the left ventricular endocardial borders. The left ventricular internal cavity size was normal in size. There is mild left ventricular hypertrophy. Left ventricular diastolic parameters are indeterminate. Right Ventricle: The right ventricular size is not well visualized. Right vetricular wall thickness was not well visualized. Right ventricular systolic function was not well visualized. Left Atrium: Left atrial size was normal in size. Right Atrium: Right atrial size was normal in size. Pericardium: There is no evidence of pericardial effusion. Mitral Valve: The mitral valve is normal in structure. No evidence of mitral valve regurgitation. No evidence of mitral valve stenosis. MV peak gradient, 2.5 mmHg. The mean mitral valve gradient is 1.0 mmHg. Tricuspid Valve: The tricuspid  valve is not well visualized. Tricuspid valve regurgitation is trivial. No evidence of tricuspid stenosis. Aortic Valve: The aortic valve is normal in structure. Aortic valve regurgitation is not visualized. No aortic stenosis is present. Aortic valve mean gradient measures 3.0 mmHg. Aortic valve peak gradient measures 5.2 mmHg. Aortic valve area, by VTI measures 2.18 cm. Pulmonic Valve: The pulmonic valve was not well visualized. Pulmonic valve regurgitation is not visualized. No evidence of pulmonic stenosis. Aorta: The aortic root is normal in size and structure. Venous: The inferior vena cava was  not well visualized. IAS/Shunts: No atrial level shunt detected by color flow Doppler.  LEFT VENTRICLE PLAX 2D LVIDd:         5.14 cm   Diastology LVIDs:         4.02 cm   LV e' medial:    9.90 cm/s LV PW:         1.12 cm   LV E/e' medial:  7.7 LV IVS:        1.13 cm   LV e' lateral:   10.30 cm/s LVOT diam:     2.10 cm   LV E/e' lateral: 7.4 LV SV:         38 LV SV Index:   18 LVOT Area:     3.46 cm  LEFT ATRIUM         Index LA diam:    4.10 cm 1.90 cm/m  AORTIC VALVE                    PULMONIC VALVE AV Area (Vmax):    2.00 cm     PV Vmax:       0.85 m/s AV Area (Vmean):   2.02 cm     PV Vmean:      57.100 cm/s AV Area (VTI):     2.18 cm     PV VTI:        0.130 m AV Vmax:           114.00 cm/s  PV Peak grad:  2.9 mmHg AV Vmean:          79.700 cm/s  PV Mean grad:  1.0 mmHg AV VTI:            0.176 m AV Peak Grad:      5.2 mmHg AV Mean Grad:      3.0 mmHg LVOT Vmax:         65.90 cm/s LVOT Vmean:        46.500 cm/s LVOT VTI:          0.111 m LVOT/AV VTI ratio: 0.63  AORTA Ao Root diam: 3.10 cm MITRAL VALVE MV Area (PHT): 3.91 cm    SHUNTS MV Area VTI:   2.54 cm    Systemic VTI:  0.11 m MV Peak grad:  2.5 mmHg    Systemic Diam: 2.10 cm MV Mean grad:  1.0 mmHg MV Vmax:       0.80 m/s MV Vmean:      37.8 cm/s MV Decel Time: 194 msec MV E velocity: 75.85 cm/s MV A velocity: 25.30 cm/s MV E/A ratio:  3.00 Donnelly Angelica Electronically signed by Donnelly Angelica Signature Date/Time: 12/13/2021/12:51:16 PM    Final      Scheduled Meds:  allopurinol  300 mg Oral Daily   carvedilol  50 mg Oral BID   enoxaparin (LOVENOX) injection  40 mg Subcutaneous Q24H   feeding supplement  237 mL Oral TID BM   lisinopril  40 mg Oral Daily   spironolactone  25 mg Oral Daily   Continuous Infusions:   LOS: 2 days     Enzo Bi, MD [facility] If 7PM-7AM, please contact night-coverage 12/13/2021, 8:36 PM

## 2021-12-13 NOTE — Progress Notes (Signed)
*  PRELIMINARY RESULTS* Echocardiogram 2D Echocardiogram has been performed.  Ivan Pope 12/13/2021, 11:10 AM

## 2021-12-14 ENCOUNTER — Telehealth: Payer: Self-pay | Admitting: Oncology

## 2021-12-14 LAB — COMPREHENSIVE METABOLIC PANEL
ALT: 29 U/L (ref 0–44)
AST: 36 U/L (ref 15–41)
Albumin: 2.3 g/dL — ABNORMAL LOW (ref 3.5–5.0)
Alkaline Phosphatase: 202 U/L — ABNORMAL HIGH (ref 38–126)
Anion gap: 8 (ref 5–15)
BUN: 33 mg/dL — ABNORMAL HIGH (ref 8–23)
CO2: 24 mmol/L (ref 22–32)
Calcium: 8.9 mg/dL (ref 8.9–10.3)
Chloride: 98 mmol/L (ref 98–111)
Creatinine, Ser: UNDETERMINED mg/dL (ref 0.61–1.24)
Glucose, Bld: 103 mg/dL — ABNORMAL HIGH (ref 70–99)
Potassium: 4.6 mmol/L (ref 3.5–5.1)
Sodium: 130 mmol/L — ABNORMAL LOW (ref 135–145)
Total Bilirubin: 22.9 mg/dL (ref 0.3–1.2)
Total Protein: 6.5 g/dL (ref 6.5–8.1)

## 2021-12-14 LAB — CBC
Hemoglobin: 14.2 g/dL (ref 13.0–17.0)
Platelets: 159 10*3/uL (ref 150–400)
WBC: 7.3 10*3/uL (ref 4.0–10.5)

## 2021-12-14 LAB — MAGNESIUM: Magnesium: 2 mg/dL (ref 1.7–2.4)

## 2021-12-14 MED ORDER — CARVEDILOL 25 MG PO TABS
25.0000 mg | ORAL_TABLET | Freq: Two times a day (BID) | ORAL | Status: DC
  Administered 2021-12-14: 25 mg via ORAL
  Filled 2021-12-14: qty 1

## 2021-12-14 MED ORDER — CARVEDILOL 25 MG PO TABS: 25.0000 mg | ORAL_TABLET | Freq: Two times a day (BID) | ORAL | 2 refills | Status: DC

## 2021-12-14 MED ORDER — CARVEDILOL 25 MG PO TABS: 25.0000 mg | ORAL_TABLET | Freq: Two times a day (BID) | ORAL | Status: DC

## 2021-12-14 NOTE — Telephone Encounter (Signed)
Please schedule pt to see MD tomorrow -late morning appt . Please inform pt of appt.

## 2021-12-14 NOTE — Telephone Encounter (Signed)
Pt is being discharged for hospital and needs follow up appt. Call back at [phone]

## 2021-12-14 NOTE — Telephone Encounter (Signed)
Dr. Tasia Catchings pleas advise on followup plan

## 2021-12-14 NOTE — Progress Notes (Signed)
Patient [name] via w/c in stable condition.

## 2021-12-14 NOTE — Care Management Important Message (Signed)
Important Message  Patient Details  Name: Ivan Pope MRN: [id number] Date of Birth: [birth year]   Medicare Important Message Given:  Yes     [name] 12/14/2021, 10:05 AM

## 2021-12-14 NOTE — Discharge Summary (Signed)
Physician Discharge Summary   Ivan Pope  male DOB: 1951/02/06  RJJ:[id number]  PCP: Kirk Ruths, MD  Admit date: 12/11/2021 Discharge date: 12/14/2021  Admitted From: home Disposition:  home CODE STATUS: Full code  Discharge Instructions     Discharge instructions   Complete by: As directed    Unfortunately you have masses in your pancrease and liver, from cancer.  You have received liver biopsy.  Please follow up with oncology Dr. Tasia Catchings for further management.  Please hold your blood pressure medications Lisinopril and spironolactone pending outpatient followup with PCP, because your blood pressure has been low normal in the hospital without them.    I have prescribed coreg 25 mg twice daily to your pharmacy, this is to control your heart rate.   Dr. Enzo Bi - -   No wound care   Complete by: As directed    No wound care   Complete by: As directed         Hospital Course:  For full details, please see H&P, progress notes, consult notes and ancillary notes.  Briefly,  Ivan Pope is a 71 y.o. male with medical history significant for gout, hypertension, and cholelithiasis, who presented to the ED on 12/11/3021 for further evaluation of abnormal labs with total bilirubin 19.8, direct bili 14.3, Alk phos 225.  Pt reported recent fatigue all the time, generalized weakness, diminished appetite with early satiety, jaundice and  decreased urine output.  He was seen by PCP and referred to medical oncology.     MRI of the abdomen on 1/30 obtained, showing 1) a large mass arising off the distal tail of pancreas invading the splenic hilum and invading the capsule overlying the upper pole of the left kidney. This is highly concerning for primary pancreatic adenocarcinoma; 2) innumerable, peripherally enhancing lesions scattered throughout both lobes of liver compatible with liver metastases; 3) mild intrahepatic bile duct dilatation secondary to mass effect from  central liver metastases. No common bile duct dilatation identified. 4) Right adrenal gland adenoma.  Admitted to medicine service.  Oncology consulted, recommended biopsy.  IR was consulted and performed U/S guided liver biopsy on 1/31.   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   Biopsy of liver lesion was obtained on 1/31.  Patient presented with abnormal labs, fatigue, weakness, poor PO intake with early satiety.  He denies abdominal pain nausea or vomiting. --outpatient followup with oncology   Liver metastases [facility])- (present on admission) Liver imaging has been consistent with cirrhosis.  Lesions seen are suspected metastatic from primary pancreatic cancer but primary liver source also possible. Biopsy of liver lesion was obtained on 1/31.   --outpatient followup with oncology   Hyperbilirubinemia- (present on admission) --due to combination of intrinsic liver disease and scattered sequestered bile ducts from the diffuse metastatic disease in the liver. The biliary ducts as a whole are not dilated and the CBD is normal in caliber, so no single obstructing lesion centrally that can be addressed with drain placement.    Unintentional weight loss- (present on admission) - due to underlying cancer.   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  Renal function is currently normal but appears history of CKD stage IIIa. Current creatinine is 1.17.     New onset atrial fibrillation (HCC) Heart rate is controlled. --Cardiology consulted --received coreg 50 mg BID while inpatient, however, BP was low normal, so pt was discharged on coreg 25 mg BID. --hold  anticoagulation for now, pending cancer eval and management   Essential hypertension- (present on admission) Blood pressure has been low normal even without home Lisinopril and aldactone, so both held at discharge. --pt was discharged on coreg 25 mg BID.   Gout-  (present on admission) Without acute flare symptoms at this time.  Continue home allopurinol.   Hyponatremia- (present on admission) POA, sodium 130, mild.   --s/p IV hydration given his recent poor p.o. intake.      Discharge Diagnoses:  Principal Problem:   Pancreatic mass Active Problems:   Hyperbilirubinemia   Unintentional weight loss   New onset atrial fibrillation (HCC)   Liver metastases (HCC)   Hyponatremia   Essential hypertension   Gout   Hypertensive kidney disease with CKD stage III ([place])   30 Day Unplanned Readmission Risk Score    Flowsheet Row ED to Hosp-Admission (Current) from 12/11/2021 in [place] (1C)  30 Day Unplanned Readmission Risk Score (%) 16.29 Filed at 12/14/2021 0801       This score is the patient's risk of an unplanned readmission within 30 days of being discharged (0 -100%). The score is based on dignosis, age, lab data, medications, orders, and past utilization.   Low:  0-14.9   Medium: 15-21.9   High: 22-29.9   Extreme: 30 and above         Discharge Instructions:  Allergies as of 12/14/2021   No Known Allergies      Medication List     TAKE these medications    allopurinol 100 MG tablet Commonly known as: ZYLOPRIM TAKE 1 TABLET BY MOUTH ONCE DAILY (TAKE  WITH  300MG  TABLET  DAILY  FOR  A  TOTAL  DAILY  DOSE  OF  400MG)   allopurinol 300 MG tablet Commonly known as: ZYLOPRIM TAKE 1 TABLET BY MOUTH ONCE DAILY (TAKE ALONG WITH 100MG TABLET)   carvedilol 25 MG tablet Commonly known as: COREG Take 1 tablet (25 mg total) by mouth 2 (two) times daily. What changed: how much to take   feeding supplement Liqd Take 237 mLs by mouth 3 (three) times daily between meals.   lisinopril 40 MG tablet Commonly known as: ZESTRIL Hold until PCP followup since your blood pressure has been low normal. What changed:  how much to take how to take this when to take this additional instructions    spironolactone 25 MG tablet Commonly known as: ALDACTONE Hold until PCP followup since your blood pressure has been low normal. What changed:  how much to take how to take this when to take this additional instructions   tadalafil 5 MG tablet Commonly known as: CIALIS Take 1 tablet (5 mg total) by mouth daily as needed for erectile dysfunction. Home med. What changed:  when to take this reasons to take this additional instructions         Follow-up Information     Kirk Ruths, MD. Go on 12/21/2021.   Specialty: Internal Medicine Why: _0  Contact information: [address] [place] [address] [phone]         Earlie Server, MD. Daphane Shepherd on 12/15/2021.   Specialty: Oncology Why: @ 9:30am Contact information: [place] [address] [phone]                 No Known Allergies   The results of significant diagnostics from this hospitalization (including imaging, microbiology, ancillary and laboratory) are  listed below for reference.   Consultations:   Procedures/Studies: DG Chest 2 View  Result Date: 12/01/2021 CLINICAL DATA:  Shortness of breath with exertion. EXAM: CHEST - 2 VIEW COMPARISON:  None. FINDINGS: The lungs are clear without focal pneumonia, edema, pneumothorax or pleural effusion. Cardiopericardial silhouette is at upper limits of normal for size. There is pulmonary vascular congestion without overt pulmonary edema. Streaky opacity at the left base suggest atelectasis. The visualized bony structures of the thorax show no acute abnormality. IMPRESSION: Pulmonary vascular congestion without overt pulmonary edema. Electronically Signed   By: Misty Stanley M.D.   On: 12/01/2021 15:08   CT Angio Chest Pulmonary Embolism (PE) W or WO Contrast  Result Date: 12/05/2021 CLINICAL DATA:  Shortness of breath. EXAM: CT ANGIOGRAPHY CHEST WITH CONTRAST TECHNIQUE: Multidetector CT imaging of the chest  was performed using the standard protocol during bolus administration of intravenous contrast. Multiplanar CT image reconstructions and MIPs were obtained to evaluate the vascular anatomy. RADIATION DOSE REDUCTION: This exam was performed according to the departmental dose-optimization program which includes automated exposure control, adjustment of the mA and/or kV according to patient size and/or use of iterative reconstruction technique. CONTRAST:  98m OMNIPAQUE IOHEXOL 350 MG/ML SOLN COMPARISON:  December 01, 2021. FINDINGS: Cardiovascular: Satisfactory opacification of the pulmonary arteries to the segmental level. No evidence of pulmonary embolism. Normal heart size. No pericardial effusion. Atherosclerosis of thoracic aorta is noted without aneurysm formation. Mediastinum/Nodes: No enlarged mediastinal, hilar, or axillary lymph nodes. Thyroid gland, trachea, and esophagus demonstrate no significant findings. Lungs/Pleura: No pneumothorax or pleural effusion is noted. 11 x 10 mm ill-defined sub solid density is noted in right lower lobe. Mild left posterior basilar subsegmental atelectasis is noted. Upper Abdomen: Multiple ill-defined low densities are noted throughout hepatic parenchyma consistent with metastatic disease. Musculoskeletal: No chest wall abnormality. No acute or significant osseous findings. Review of the MIP images confirms the above findings. IMPRESSION: No definite evidence of pulmonary embolus. Multiple ill-defined hepatic low densities are noted consistent with hepatic metastatic disease. These results will be called to the ordering clinician or representative by the Radiologist Assistant, and communication documented in the PACS or zVision Dashboard. 10 x 11 mm ill-defined sub solid density is noted in right lower lobe. Initial follow-up by chest CT without contrast is recommended in 3 months to confirm persistence. This recommendation follows the consensus statement: Recommendations for  the Management of Subsolid Pulmonary Nodules Detected at CT: A Statement from the [place] published in Radiology 2013; 266:304-317. Aortic Atherosclerosis (ICD10-I70.0). Electronically Signed   By: JMarijo ConceptionM.D.   On: 12/05/2021 10:53   MR Abdomen W Wo Contrast  Result Date: 12/11/2021 CLINICAL DATA:  Evaluate liver lesions identified on recent CT. EXAM: MRI ABDOMEN WITHOUT AND WITH CONTRAST TECHNIQUE: Multiplanar multisequence MR imaging of the abdomen was performed both before and after the administration of intravenous contrast. CONTRAST:  165mGADAVIST GADOBUTROL 1 MMOL/ML IV SOLN COMPARISON:  12/05/2021 FINDINGS: Lower chest: Trace left pleural effusion Hepatobiliary: There are innumerable, peripherally enhancing, T2 hyperintense and T1 hypointense lesions scattered throughout both lobes of liver compatible with liver metastases. Lesions are too numerous to count, including: Index lesion within segment 2 measures 2.9 x 2.4 cm, image 26/5. Index lesion within segment 3 measures 2.6 x 2.2 cm, image 42/15. Index lesion within segment 6 measures 2.1 x 2.2 cm, image 53/15. Index lesion within segment 7/8 measures 2.8 x 1.9 cm, image 23/15. Index lesion central aspect of the liver  there is a 2.7 by 1.9 cm, image 40/17. This results in central intrahepatic biliary dilatation with mild dilatation of the peripheral biliary radicles in the right hepatic lobe. Gallbladder appears collapsed with a stone identified within the fundus. The CBD has a normal caliber. There is dilatation of the peripheral biliary radicles within both lobes of liver likely reflecting mass effect from the diffuse liver metastases. Pancreas: Mass arising off the distal tail of pancreas invades the splenic hilum. The tumor also extends to involve the capsule overlying the upper pole of the left kidney, image 26/19. This measures 3.7 by 3.8 cm, image 45/13 and is highly concerning for primary pancreatic adenocarcinoma. No  signs of pancreatic inflammation or main duct dilatation. Spleen: Tail of pancreas mass is seen invading the splenic hilum, image 24/19. Adrenals/Urinary Tract: There is a peripherally enhancing nodule involving the right adrenal gland measuring 1.6 cm, image 39/20. There is loss of signal within this nodule between the inphase and out of phase sequences compatible with a benign adenoma. Hypertrophy of the left adrenal gland noted without discrete mass. Multiple small bilateral kidney cysts are identified, which are technically too small to reliably characterize. No signs of hydronephrosis or mass. Stomach/Bowel: Visualized portions within the abdomen are unremarkable. Vascular/Lymphatic: No pathologically enlarged lymph nodes identified. No abdominal aortic aneurysm demonstrated. Aortic atherosclerosis. Other:  No free fluid or fluid collections identified. Musculoskeletal: No suspicious bone lesions identified. IMPRESSION: 1. There is a large mass arising off the distal tail of pancreas invading the splenic hilum and invading the capsule overlying the upper pole of the left kidney. This is highly concerning for primary pancreatic adenocarcinoma. 2. Innumerable, peripherally enhancing lesions scattered throughout both lobes of liver compatible with liver metastases. 3. Mild intrahepatic bile duct dilatation secondary to mass effect from central liver metastases. No common bile duct dilatation identified. 4. Right adrenal gland adenoma. These results will be called to the ordering clinician or representative by the Radiologist Assistant, and communication documented in the PACS or [employer]. Electronically Signed   By: Kerby Moors M.D.   On: 12/11/2021 13:16   US BIOPSY (LIVER)  Result Date: 12/12/2021 INDICATION: 71 year old gentleman with pancreatic mass and multiple hepatic metastatic lesions presents to IR for ultrasound-guided biopsy. EXAM: Ultrasound-guided biopsy of left hepatic mass.  MEDICATIONS: None. ANESTHESIA/SEDATION: Moderate (conscious) sedation was employed during this procedure. A total of Versed 1 mg and Fentanyl 50 mcg was administered intravenously. Moderate Sedation Time: 10 minutes. The patient's level of consciousness and vital signs were monitored continuously by radiology nursing throughout the procedure under my direct supervision. COMPLICATIONS: None immediate. PROCEDURE: Informed written consent was obtained from the patient after a thorough discussion of the procedural risks, benefits and alternatives. All questions were addressed. Maximal Sterile Barrier Technique was utilized including caps, mask, sterile gowns, sterile gloves, sterile drape, hand hygiene and skin antiseptic. A timeout was performed prior to the initiation of the procedure. Patient position supine on the ultrasound table. Epigastric skin prepped and draped in usual sterile fashion. Following local lidocaine administration, 17 gauge introducer needle was advanced into 1 of the left hepatic lobe lesion, and 4- 18 gauge cores were obtained utilizing continuous ultrasound guidance. Gelfoam slurry was administered through the introducer needle at the biopsy site. Samples were sent to pathology in formalin. Needle removed and hemostasis achieved with 5 minutes of manual compression. Post procedure ultrasound images showed no evidence of significant hemorrhage. IMPRESSION: Ultrasound-guided biopsy of left hepatic lesion as above. Electronically Signed  By: Sharen Heck  Mir M.D.   On: 12/12/2021 15:45   ECHOCARDIOGRAM COMPLETE  Result Date: 12/13/2021    ECHOCARDIOGRAM REPORT   Patient Name:   SILVESTRE MINES Date of Exam: 12/13/2021 Medical Rec #:  [id number]       Height:       71.0 in Accession #:    [id number]      Weight:       211.0 lb Date of Birth:  [birth year]       BSA:          2.157 m Patient Age:    9 years        BP:           99/58 mmHg Patient Gender: M               HR:           79 bpm. Exam  Location:  [facility] Procedure: 2D Echo, Color Doppler, Cardiac Doppler and Intracardiac            Opacification Agent Indications:     I48.91 Atrial fibrillation  History:         Patient has no prior history of Echocardiogram examinations.                  Risk Factors:Hypertension.  Sonographer:     Charmayne Sheer Referring Phys:  [id number] Martinton TANG Diagnosing Phys: Donnelly Angelica  Sonographer Comments: Suboptimal apical window and suboptimal subcostal window. Image acquisition challenging due to patient body habitus. IMPRESSIONS  1. Left ventricular ejection fraction, by estimation, is 50 to 55%. The left ventricle has low normal function. The left ventricle has no regional wall motion abnormalities. There is mild left ventricular hypertrophy. Left ventricular diastolic parameters are indeterminate.  2. Right ventricular systolic function was not well visualized. The right ventricular size is not well visualized.  3. The mitral valve is normal in structure. No evidence of mitral valve regurgitation. No evidence of mitral stenosis.  4. The aortic valve is normal in structure. Aortic valve regurgitation is not visualized. No aortic stenosis is present. FINDINGS  Left Ventricle: Left ventricular ejection fraction, by estimation, is 50 to 55%. The left ventricle has low normal function. The left ventricle has no regional wall motion abnormalities. Definity contrast agent was given IV to delineate the left ventricular endocardial borders. The left ventricular internal cavity size was normal in size. There is mild left ventricular hypertrophy. Left ventricular diastolic parameters are indeterminate. Right Ventricle: The right ventricular size is not well visualized. Right vetricular wall thickness was not well visualized. Right ventricular systolic function was not well visualized. Left Atrium: Left atrial size was normal in size. Right Atrium: Right atrial size was normal in size. Pericardium: There is no evidence of  pericardial effusion. Mitral Valve: The mitral valve is normal in structure. No evidence of mitral valve regurgitation. No evidence of mitral valve stenosis. MV peak gradient, 2.5 mmHg. The mean mitral valve gradient is 1.0 mmHg. Tricuspid Valve: The tricuspid valve is not well visualized. Tricuspid valve regurgitation is trivial. No evidence of tricuspid stenosis. Aortic Valve: The aortic valve is normal in structure. Aortic valve regurgitation is not visualized. No aortic stenosis is present. Aortic valve mean gradient measures 3.0 mmHg. Aortic valve peak gradient measures 5.2 mmHg. Aortic valve area, by VTI measures 2.18 cm. Pulmonic Valve: The pulmonic valve was not well visualized. Pulmonic valve regurgitation is not visualized. No evidence of pulmonic stenosis. Aorta: The aortic root  is normal in size and structure. Venous: The inferior vena cava was not well visualized. IAS/Shunts: No atrial level shunt detected by color flow Doppler.  LEFT VENTRICLE PLAX 2D LVIDd:         5.14 cm   Diastology LVIDs:         4.02 cm   LV e' medial:    9.90 cm/s LV PW:         1.12 cm   LV E/e' medial:  7.7 LV IVS:        1.13 cm   LV e' lateral:   10.30 cm/s LVOT diam:     2.10 cm   LV E/e' lateral: 7.4 LV SV:         38 LV SV Index:   18 LVOT Area:     3.46 cm  LEFT ATRIUM         Index LA diam:    4.10 cm 1.90 cm/m  AORTIC VALVE                    PULMONIC VALVE AV Area (Vmax):    2.00 cm     PV Vmax:       0.85 m/s AV Area (Vmean):   2.02 cm     PV Vmean:      57.100 cm/s AV Area (VTI):     2.18 cm     PV VTI:        0.130 m AV Vmax:           114.00 cm/s  PV Peak grad:  2.9 mmHg AV Vmean:          79.700 cm/s  PV Mean grad:  1.0 mmHg AV VTI:            0.176 m AV Peak Grad:      5.2 mmHg AV Mean Grad:      3.0 mmHg LVOT Vmax:         65.90 cm/s LVOT Vmean:        46.500 cm/s LVOT VTI:          0.111 m LVOT/AV VTI ratio: 0.63  AORTA Ao Root diam: 3.10 cm MITRAL VALVE MV Area (PHT): 3.91 cm    SHUNTS MV Area VTI:    2.54 cm    Systemic VTI:  0.11 m MV Peak grad:  2.5 mmHg    Systemic Diam: 2.10 cm MV Mean grad:  1.0 mmHg MV Vmax:       0.80 m/s MV Vmean:      37.8 cm/s MV Decel Time: 194 msec MV E velocity: 75.85 cm/s MV A velocity: 25.30 cm/s MV E/A ratio:  3.00 Donnelly Angelica Electronically signed by Donnelly Angelica Signature Date/Time: 12/13/2021/12:51:16 PM    Final    US Abdomen Limited RUQ (LIVER/GB)  Result Date: 12/05/2021 CLINICAL DATA:  Elevated bilirubin EXAM: ULTRASOUND ABDOMEN LIMITED RIGHT UPPER QUADRANT COMPARISON:  CT a chest 12/05/2021 FINDINGS: Gallbladder: The gallbladder is contracted. Gallbladder appears impacted with shadowing stones. The wall appears mildly thickened measuring up to 3 mm, though this is likely due to contraction. There is no pericholecystic fluid. Sonographic Murphy's sign was reported negative. Common bile duct: Diameter: 3 mm Liver: The liver is markedly heterogeneous in echotexture. The liver surface contour is nodular. Numerous heterogeneously echoic masses are seen corresponding to the low-density lesion seen on the prior CT chest. Portal vein is patent on color Doppler imaging with normal direction of blood flow towards the  liver. Other: None. IMPRESSION: 1. Nodular surface contour suggesting a background of cirrhosis. 2. Numerous heterogeneously echoic masses consistent with metastatic disease as seen on the prior CTA chest. 3. Contracted gallbladder which is impacted with gallstones. Mild gallbladder wall thickening is likely due to contraction. There is no pericholecystic fluid, and sonographic Murphy's sign was reported negative. 4. No biliary ductal dilatation. Electronically Signed   By: Valetta Mole M.D.   On: 12/05/2021 11:30      Labs: BNP (last 3 results) No results for input(s): BNP in the last 8760 hours. Basic Metabolic Panel: Recent Labs  Lab 12/11/21 1158 12/12/21 0534 12/13/21 0520 12/14/21 0539  NA 130* 131* 132* 130*  K 4.5 4.4 5.1 4.6  CL 96* 98 98 98   CO2 _0 GLUCOSE 142* 116* 102* 103*  BUN 20 25* 26* 33*  CREATININE 1.13 1.17 1.24 UNABLE TO REPORT DUE TO ICTERUS/HKP  CALCIUM 8.6* 8.7* 8.9 8.9  MG  --   --  2.0 2.0   Liver Function Tests: Recent Labs  Lab 12/11/21 1158 12/12/21 0534 12/13/21 0520 12/14/21 0539  AST 37 35 36 36  ALT 35 33 30 29  ALKPHOS 225* 214* 196* 202*  BILITOT 19.8* 19.8* 21.1* 22.9*  PROT 7.6 6.9 6.3* 6.5  ALBUMIN 2.7* 2.5* 2.4* 2.3*   No results for input(s): LIPASE, AMYLASE in the last 168 hours. No results for input(s): AMMONIA in the last 168 hours. CBC: Recent Labs  Lab 12/11/21 1158 12/12/21 0534 12/14/21 0539  WBC 7.8 8.0 7.3  NEUTROABS 6.0  --   --   HGB 15.7 14.1 14.2  HCT 42.1 38.5* RESULTS UNAVAILABLE DUE TO INTERFERING SUBSTANCE  MCV 79.9* 80.5 RESULTS UNAVAILABLE DUE TO INTERFERING SUBSTANCE  PLT 157 170 159   Cardiac Enzymes: No results for input(s): CKTOTAL, CKMB, CKMBINDEX, TROPONINI in the last 168 hours. BNP: Invalid input(s): POCBNP CBG: No results for input(s): GLUCAP in the last 168 hours. D-Dimer No results for input(s): DDIMER in the last 72 hours. Hgb A1c No results for input(s): HGBA1C in the last 72 hours. Lipid Profile No results for input(s): CHOL, HDL, LDLCALC, TRIG, CHOLHDL, LDLDIRECT in the last 72 hours. Thyroid function studies No results for input(s): TSH, T4TOTAL, T3FREE, THYROIDAB in the last 72 hours.  Invalid input(s): FREET3 Anemia work up Recent Labs    12/11/21 1158  FERRITIN 797*  TIBC 238*  IRON 101   Urinalysis No results found for: COLORURINE, APPEARANCEUR, LABSPEC, [place], GLUCOSEU, [place], [place], [place], PROTEINUR, UROBILINOGEN, NITRITE, LEUKOCYTESUR Sepsis Labs Invalid input(s): PROCALCITONIN,  WBC,  LACTICIDVEN Microbiology Recent Results (from the past 240 hour(s))  Resp Panel by RT-PCR (Flu A&B, Covid) Nasopharyngeal Swab     Status: None   Collection Time: 12/11/21  9:03 PM   Specimen: Nasopharyngeal  Swab; Nasopharyngeal(NP) swabs in vial transport medium  Result Value Ref Range Status   SARS Coronavirus 2 by RT PCR NEGATIVE NEGATIVE Final    Comment: (NOTE) SARS-CoV-2 target nucleic acids are NOT DETECTED.  The SARS-CoV-2 RNA is generally detectable in upper respiratory specimens during the acute phase of infection. The lowest concentration of SARS-CoV-2 viral copies this assay can detect is 138 copies/mL. A negative result does not preclude SARS-Cov-2 infection and should not be used as the sole basis for treatment or other patient management decisions. A negative result may occur with  improper specimen collection/handling, submission of specimen other than nasopharyngeal swab, presence of viral mutation(s) within the areas targeted by  this assay, and inadequate number of viral copies(<138 copies/mL). A negative result must be combined with clinical observations, patient history, and epidemiological information. The expected result is Negative.  Fact Sheet for Patients:  EntrepreneurPulse.com.au  Fact Sheet for Healthcare Providers:  IncredibleEmployment.be  This test is no t yet approved or cleared by the [place] FDA and  has been authorized for detection and/or diagnosis of SARS-CoV-2 by FDA under an Emergency Use Authorization (EUA). This EUA will remain  in effect (meaning this test can be used) for the duration of the COVID-19 declaration under Section 564(b)(1) of the Act, 21 U.S.C.section 360bbb-3(b)(1), unless the authorization is terminated  or revoked sooner.       Influenza A by PCR NEGATIVE NEGATIVE Final   Influenza B by PCR NEGATIVE NEGATIVE Final    Comment: (NOTE) The Xpert Xpress SARS-CoV-2/FLU/RSV plus assay is intended as an aid in the diagnosis of influenza from Nasopharyngeal swab specimens and should not be used as a sole basis for treatment. Nasal washings and aspirates are unacceptable for Xpert Xpress  SARS-CoV-2/FLU/RSV testing.  Fact Sheet for Patients: EntrepreneurPulse.com.au  Fact Sheet for Healthcare Providers: IncredibleEmployment.be  This test is not yet approved or cleared by the [place] FDA and has been authorized for detection and/or diagnosis of SARS-CoV-2 by FDA under an Emergency Use Authorization (EUA). This EUA will remain in effect (meaning this test can be used) for the duration of the COVID-19 declaration under Section 564(b)(1) of the Act, 21 U.S.C. section 360bbb-3(b)(1), unless the authorization is terminated or revoked.  Performed at [facility], [address] [place], [place] [address]      Total time spend on discharging this patient, including the last patient exam, discussing the hospital stay, instructions for ongoing care as it relates to all pertinent caregivers, as well as preparing the medical discharge records, prescriptions, and/or referrals as applicable, is 30 minutes.    Enzo Bi, MD  [facility] 12/14/2021, 9:24 AM

## 2021-12-14 NOTE — Telephone Encounter (Signed)
Appt made and pt made aware

## 2021-12-14 NOTE — Progress Notes (Signed)
Patient given discharge instructions. Verbalized understanding of all d/c instructions including follow up appointment with oncology tomorrow morning. Patient verbalized understanding of reduction of coreg to 25mg  BID. MD to call in rx to pharmacy.

## 2021-12-15 ENCOUNTER — Other Ambulatory Visit: Payer: Self-pay

## 2021-12-15 ENCOUNTER — Encounter: Payer: Self-pay | Admitting: Oncology

## 2021-12-15 ENCOUNTER — Inpatient Hospital Stay: Payer: Medicare Other | Attending: Oncology | Admitting: Oncology

## 2021-12-15 ENCOUNTER — Inpatient Hospital Stay (HOSPITAL_BASED_OUTPATIENT_CLINIC_OR_DEPARTMENT_OTHER): Payer: Medicare Other | Admitting: Hospice and Palliative Medicine

## 2021-12-15 VITALS — BP 91/62 | HR 72 | Temp 96.6°F | Wt 228.0 lb

## 2021-12-15 DIAGNOSIS — K8689 Other specified diseases of pancreas: Secondary | ICD-10-CM

## 2021-12-15 DIAGNOSIS — C259 Malignant neoplasm of pancreas, unspecified: Secondary | ICD-10-CM

## 2021-12-15 DIAGNOSIS — C787 Secondary malignant neoplasm of liver and intrahepatic bile duct: Secondary | ICD-10-CM | POA: Insufficient documentation

## 2021-12-15 DIAGNOSIS — Z7189 Other specified counseling: Secondary | ICD-10-CM

## 2021-12-15 DIAGNOSIS — Z515 Encounter for palliative care: Secondary | ICD-10-CM

## 2021-12-15 DIAGNOSIS — G893 Neoplasm related pain (acute) (chronic): Secondary | ICD-10-CM | POA: Diagnosis not present

## 2021-12-15 DIAGNOSIS — C252 Malignant neoplasm of tail of pancreas: Secondary | ICD-10-CM | POA: Insufficient documentation

## 2021-12-15 LAB — SURGICAL PATHOLOGY

## 2021-12-15 MED ORDER — ONDANSETRON HCL 8 MG PO TABS: 8.0000 mg | ORAL_TABLET | Freq: Three times a day (TID) | ORAL | 0 refills | Status: AC | PRN

## 2021-12-15 MED ORDER — DEXAMETHASONE 4 MG PO TABS: 4.0000 mg | ORAL_TABLET | Freq: Every day | ORAL | 0 refills | Status: DC

## 2021-12-15 MED ORDER — MORPHINE SULFATE (CONCENTRATE) 10 MG /0.5 ML PO SOLN: 5.0000 mg | ORAL | 0 refills | Status: AC | PRN

## 2021-12-15 NOTE — Progress Notes (Signed)
[place] [facility] [phone] [phone]) [phone]   Name: Ivan Pope Date: 12/15/2021 MRN: [id number]  DOB: [birth year]  Patient Care Team: Kirk Ruths, MD as PCP - General (Internal Medicine) Clent Jacks, RN as Oncology Nurse Navigator Earlie Server, MD as Consulting Physician (Hematology and Oncology) Emmaline Kluver., MD as Consulting Physician (Rheumatology) Efrain Sella, MD as Consulting Physician (Gastroenterology)    REASON FOR CONSULTATION: Ivan Pope is a 71 y.o. male with multiple medical problems including gout, hypertension, and cholelithiasis, who was recently hospitalized 12/11/2021 to 12/14/2021 with progressive weakness poor appetite, and jaundice.  MRI of the abdomen on 1/30 showed a large mass in the tail of the pancreas invading the splenic hilum and upper pole of the left kidney, innumerable liver metastases, mild intrahepatic bile duct dilation secondary to mass-effect.  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  Patient has had rising bilirubin.  He was referred to palliative care to help address goals.  SOCIAL HISTORY:     reports that he has never smoked. He has never been exposed to tobacco smoke. He has never used smokeless tobacco. He reports that he does not currently use alcohol.  Patient is unmarried.  He lives at home alone.  His girlfriend lives next-door.  Patient has a daughter who lives in [place].  ADVANCE DIRECTIVES:  Not on file  CODE STATUS: DNR  PAST MEDICAL HISTORY: Past Medical History:  Diagnosis Date   Adenomatous polyp 2014   Elevated bilirubin    Erectile dysfunction    Fatigue    Gallstones    Gout    Hypertension    Liver cancer ([place]) 2023   suspected   Prediabetes    Snoring     PAST SURGICAL HISTORY:  Past Surgical History:  Procedure Laterality Date    COLONOSCOPY  2014   Dr. Darrold Span @ [facility], rpt 5 yrs per [facility]   MOUTH SURGERY      HEMATOLOGY/ONCOLOGY HISTORY:  Oncology History   No history exists.    ALLERGIES:  has No Known Allergies.  MEDICATIONS:  Current Outpatient Medications  Medication Sig Dispense Refill   allopurinol (ZYLOPRIM) 100 MG tablet TAKE 1 TABLET BY MOUTH ONCE DAILY (TAKE  WITH  300MG  TABLET  DAILY  FOR  A  TOTAL  DAILY  DOSE  OF  400MG)     allopurinol (ZYLOPRIM) 300 MG tablet TAKE 1 TABLET BY MOUTH ONCE DAILY (TAKE ALONG WITH 100MG TABLET)     carvedilol (COREG) 25 MG tablet Take 1 tablet (25 mg total) by mouth 2 (two) times daily. 60 tablet 2   feeding supplement (ENSURE ENLIVE / ENSURE PLUS) LIQD Take 237 mLs by mouth 3 (three) times daily between meals.     lisinopril (ZESTRIL) 40 MG tablet Hold until PCP followup since your blood pressure has been low normal.     spironolactone (ALDACTONE) 25 MG tablet Hold until PCP followup since your blood pressure has been low normal.     tadalafil (CIALIS) 5 MG tablet Take 1 tablet (5 mg total) by mouth daily as needed for erectile dysfunction. Home med. 10 tablet 0   No current facility-administered medications for this visit.    VITAL SIGNS: There were no vitals taken for this visit. There were no vitals filed for this visit.  Estimated body mass index is 31.8 kg/m as calculated  from the following:   Height as of 12/11/21: _0  (1.803 m).   Weight as of an earlier encounter on 12/15/21: 228 lb (103.4 kg).  LABS: CBC:    Component Value Date/Time   WBC 7.3 12/14/2021 0539   HGB 14.2 12/14/2021 0539   HCT RESULTS UNAVAILABLE DUE TO INTERFERING SUBSTANCE 12/14/2021 0539   PLT 159 12/14/2021 0539   MCV RESULTS UNAVAILABLE DUE TO INTERFERING SUBSTANCE 12/14/2021 0539   NEUTROABS 6.0 12/11/2021 1158   LYMPHSABS 0.8 12/11/2021 1158   MONOABS 0.8 12/11/2021 1158   EOSABS 0.1 12/11/2021 1158   BASOSABS 0.0 12/11/2021 1158   Comprehensive  Metabolic Panel:    Component Value Date/Time   NA 130 (L) 12/14/2021 0539   K 4.6 12/14/2021 0539   CL 98 12/14/2021 0539   CO2 24 12/14/2021 0539   BUN 33 (H) 12/14/2021 0539   CREATININE UNABLE TO REPORT DUE TO ICTERUS/HKP 12/14/2021 0539   GLUCOSE 103 (H) 12/14/2021 0539   CALCIUM 8.9 12/14/2021 0539   AST 36 12/14/2021 0539   ALT 29 12/14/2021 0539   ALKPHOS 202 (H) 12/14/2021 0539   BILITOT 22.9 (HH) 12/14/2021 0539   PROT 6.5 12/14/2021 0539   ALBUMIN 2.3 (L) 12/14/2021 0539    RADIOGRAPHIC STUDIES: DG Chest 2 View  Result Date: 12/01/2021 CLINICAL DATA:  Shortness of breath with exertion. EXAM: CHEST - 2 VIEW COMPARISON:  None. FINDINGS: The lungs are clear without focal pneumonia, edema, pneumothorax or pleural effusion. Cardiopericardial silhouette is at upper limits of normal for size. There is pulmonary vascular congestion without overt pulmonary edema. Streaky opacity at the left base suggest atelectasis. The visualized bony structures of the thorax show no acute abnormality. IMPRESSION: Pulmonary vascular congestion without overt pulmonary edema. Electronically Signed   By: Misty Stanley M.D.   On: 12/01/2021 15:08   CT Angio Chest Pulmonary Embolism (PE) W or WO Contrast  Result Date: 12/05/2021 CLINICAL DATA:  Shortness of breath. EXAM: CT ANGIOGRAPHY CHEST WITH CONTRAST TECHNIQUE: Multidetector CT imaging of the chest was performed using the standard protocol during bolus administration of intravenous contrast. Multiplanar CT image reconstructions and MIPs were obtained to evaluate the vascular anatomy. RADIATION DOSE REDUCTION: This exam was performed according to the departmental dose-optimization program which includes automated exposure control, adjustment of the mA and/or kV according to patient size and/or use of iterative reconstruction technique. CONTRAST:  50m OMNIPAQUE IOHEXOL 350 MG/ML SOLN COMPARISON:  December 01, 2021. FINDINGS: Cardiovascular: Satisfactory  opacification of the pulmonary arteries to the segmental level. No evidence of pulmonary embolism. Normal heart size. No pericardial effusion. Atherosclerosis of thoracic aorta is noted without aneurysm formation. Mediastinum/Nodes: No enlarged mediastinal, hilar, or axillary lymph nodes. Thyroid gland, trachea, and esophagus demonstrate no significant findings. Lungs/Pleura: No pneumothorax or pleural effusion is noted. 11 x 10 mm ill-defined sub solid density is noted in right lower lobe. Mild left posterior basilar subsegmental atelectasis is noted. Upper Abdomen: Multiple ill-defined low densities are noted throughout hepatic parenchyma consistent with metastatic disease. Musculoskeletal: No chest wall abnormality. No acute or significant osseous findings. Review of the MIP images confirms the above findings. IMPRESSION: No definite evidence of pulmonary embolus. Multiple ill-defined hepatic low densities are noted consistent with hepatic metastatic disease. These results will be called to the ordering clinician or representative by the Radiologist Assistant, and communication documented in the PACS or zVision Dashboard. 10 x 11 mm ill-defined sub solid density is noted in right lower lobe. Initial follow-up by chest CT  without contrast is recommended in 3 months to confirm persistence. This recommendation follows the consensus statement: Recommendations for the Management of Subsolid Pulmonary Nodules Detected at CT: A Statement from the [employer] as published in Radiology 2013; 266:304-317. Aortic Atherosclerosis (ICD10-I70.0). Electronically Signed   By: Marijo Conception M.D.   On: 12/05/2021 10:53   MR Abdomen W Wo Contrast  Result Date: 12/11/2021 CLINICAL DATA:  Evaluate liver lesions identified on recent CT. EXAM: MRI ABDOMEN WITHOUT AND WITH CONTRAST TECHNIQUE: Multiplanar multisequence MR imaging of the abdomen was performed both before and after the administration of intravenous contrast.  CONTRAST:  46m GADAVIST GADOBUTROL 1 MMOL/ML IV SOLN COMPARISON:  12/05/2021 FINDINGS: Lower chest: Trace left pleural effusion Hepatobiliary: There are innumerable, peripherally enhancing, T2 hyperintense and T1 hypointense lesions scattered throughout both lobes of liver compatible with liver metastases. Lesions are too numerous to count, including: Index lesion within segment 2 measures 2.9 x 2.4 cm, image 26/5. Index lesion within segment 3 measures 2.6 x 2.2 cm, image 42/15. Index lesion within segment 6 measures 2.1 x 2.2 cm, image 53/15. Index lesion within segment 7/8 measures 2.8 x 1.9 cm, image 23/15. Index lesion central aspect of the liver there is a 2.7 by 1.9 cm, image 40/17. This results in central intrahepatic biliary dilatation with mild dilatation of the peripheral biliary radicles in the right hepatic lobe. Gallbladder appears collapsed with a stone identified within the fundus. The CBD has a normal caliber. There is dilatation of the peripheral biliary radicles within both lobes of liver likely reflecting mass effect from the diffuse liver metastases. Pancreas: Mass arising off the distal tail of pancreas invades the splenic hilum. The tumor also extends to involve the capsule overlying the upper pole of the left kidney, image 26/19. This measures 3.7 by 3.8 cm, image 45/13 and is highly concerning for primary pancreatic adenocarcinoma. No signs of pancreatic inflammation or main duct dilatation. Spleen: Tail of pancreas mass is seen invading the splenic hilum, image 24/19. Adrenals/Urinary Tract: There is a peripherally enhancing nodule involving the right adrenal gland measuring 1.6 cm, image 39/20. There is loss of signal within this nodule between the inphase and out of phase sequences compatible with a benign adenoma. Hypertrophy of the left adrenal gland noted without discrete mass. Multiple small bilateral kidney cysts are identified, which are technically too small to reliably  characterize. No signs of hydronephrosis or mass. Stomach/Bowel: Visualized portions within the abdomen are unremarkable. Vascular/Lymphatic: No pathologically enlarged lymph nodes identified. No abdominal aortic aneurysm demonstrated. Aortic atherosclerosis. Other:  No free fluid or fluid collections identified. Musculoskeletal: No suspicious bone lesions identified. IMPRESSION: 1. There is a large mass arising off the distal tail of pancreas invading the splenic hilum and invading the capsule overlying the upper pole of the left kidney. This is highly concerning for primary pancreatic adenocarcinoma. 2. Innumerable, peripherally enhancing lesions scattered throughout both lobes of liver compatible with liver metastases. 3. Mild intrahepatic bile duct dilatation secondary to mass effect from central liver metastases. No common bile duct dilatation identified. 4. Right adrenal gland adenoma. These results will be called to the ordering clinician or representative by the Radiologist Assistant, and communication documented in the PACS or [employer] Electronically Signed   By: TKerby MoorsM.D.   On: 12/11/2021 13:16   [place]BIOPSY (LIVER)  Result Date: 12/12/2021 INDICATION: 71year old gentleman with pancreatic mass and multiple hepatic metastatic lesions presents to IR for ultrasound-guided biopsy. EXAM: Ultrasound-guided biopsy of left hepatic  mass. MEDICATIONS: None. ANESTHESIA/SEDATION: Moderate (conscious) sedation was employed during this procedure. A total of Versed 1 mg and Fentanyl 50 mcg was administered intravenously. Moderate Sedation Time: 10 minutes. The patient's level of consciousness and vital signs were monitored continuously by radiology nursing throughout the procedure under my direct supervision. COMPLICATIONS: None immediate. PROCEDURE: Informed written consent was obtained from the patient after a thorough discussion of the procedural risks, benefits and alternatives. All questions  were addressed. Maximal Sterile Barrier Technique was utilized including caps, mask, sterile gowns, sterile gloves, sterile drape, hand hygiene and skin antiseptic. A timeout was performed prior to the initiation of the procedure. Patient position supine on the ultrasound table. Epigastric skin prepped and draped in usual sterile fashion. Following local lidocaine administration, 17 gauge introducer needle was advanced into 1 of the left hepatic lobe lesion, and 4- 18 gauge cores were obtained utilizing continuous ultrasound guidance. Gelfoam slurry was administered through the introducer needle at the biopsy site. Samples were sent to pathology in formalin. Needle removed and hemostasis achieved with 5 minutes of manual compression. Post procedure ultrasound images showed no evidence of significant hemorrhage. IMPRESSION: Ultrasound-guided biopsy of left hepatic lesion as above. Electronically Signed   By: Miachel Roux M.D.   On: 12/12/2021 15:45   ECHOCARDIOGRAM COMPLETE  Result Date: 12/13/2021    ECHOCARDIOGRAM REPORT   Patient Name:   Ivan Pope Date of Exam: 12/13/2021 Medical Rec #:  [id number]       Height:       71.0 in Accession #:    [id number]      Weight:       211.0 lb Date of Birth:  08/16/1951       BSA:          2.157 m Patient Age:    59 years        BP:           99/58 mmHg Patient Gender: M               HR:           79 bpm. Exam Location:  [facility] Procedure: 2D Echo, Color Doppler, Cardiac Doppler and Intracardiac            Opacification Agent Indications:     I48.91 Atrial fibrillation  History:         Patient has no prior history of Echocardiogram examinations.                  Risk Factors:Hypertension.  Sonographer:     Charmayne Sheer Referring Phys:  [id number] [place] TANG Diagnosing Phys: Donnelly Angelica  Sonographer Comments: Suboptimal apical window and suboptimal subcostal window. Image acquisition challenging due to patient body habitus. IMPRESSIONS  1. Left ventricular ejection  fraction, by estimation, is 50 to 55%. The left ventricle has low normal function. The left ventricle has no regional wall motion abnormalities. There is mild left ventricular hypertrophy. Left ventricular diastolic parameters are indeterminate.  2. Right ventricular systolic function was not well visualized. The right ventricular size is not well visualized.  3. The mitral valve is normal in structure. No evidence of mitral valve regurgitation. No evidence of mitral stenosis.  4. The aortic valve is normal in structure. Aortic valve regurgitation is not visualized. No aortic stenosis is present. FINDINGS  Left Ventricle: Left ventricular ejection fraction, by estimation, is 50 to 55%. The left ventricle has low normal function. The left ventricle has no regional wall motion  abnormalities. Definity contrast agent was given IV to delineate the left ventricular endocardial Madelene Kaatz. The left ventricular internal cavity size was normal in size. There is mild left ventricular hypertrophy. Left ventricular diastolic parameters are indeterminate. Right Ventricle: The right ventricular size is not well visualized. Right vetricular wall thickness was not well visualized. Right ventricular systolic function was not well visualized. Left Atrium: Left atrial size was normal in size. Right Atrium: Right atrial size was normal in size. Pericardium: There is no evidence of pericardial effusion. Mitral Valve: The mitral valve is normal in structure. No evidence of mitral valve regurgitation. No evidence of mitral valve stenosis. MV peak gradient, 2.5 mmHg. The mean mitral valve gradient is 1.0 mmHg. Tricuspid Valve: The tricuspid valve is not well visualized. Tricuspid valve regurgitation is trivial. No evidence of tricuspid stenosis. Aortic Valve: The aortic valve is normal in structure. Aortic valve regurgitation is not visualized. No aortic stenosis is present. Aortic valve mean gradient measures 3.0 mmHg. Aortic valve peak  gradient measures 5.2 mmHg. Aortic valve area, by VTI measures 2.18 cm. Pulmonic Valve: The pulmonic valve was not well visualized. Pulmonic valve regurgitation is not visualized. No evidence of pulmonic stenosis. Aorta: The aortic root is normal in size and structure. Venous: The inferior vena cava was not well visualized. IAS/Shunts: No atrial level shunt detected by color flow Doppler.  LEFT VENTRICLE PLAX 2D LVIDd:         5.14 cm   Diastology LVIDs:         4.02 cm   LV e' medial:    9.90 cm/s LV PW:         1.12 cm   LV E/e' medial:  7.7 LV IVS:        1.13 cm   LV e' lateral:   10.30 cm/s LVOT diam:     2.10 cm   LV E/e' lateral: 7.4 LV SV:         38 LV SV Index:   18 LVOT Area:     3.46 cm  LEFT ATRIUM         Index LA diam:    4.10 cm 1.90 cm/m  AORTIC VALVE                    PULMONIC VALVE AV Area (Vmax):    2.00 cm     PV Vmax:       0.85 m/s AV Area (Vmean):   2.02 cm     PV Vmean:      57.100 cm/s AV Area (VTI):     2.18 cm     PV VTI:        0.130 m AV Vmax:           114.00 cm/s  PV Peak grad:  2.9 mmHg AV Vmean:          79.700 cm/s  PV Mean grad:  1.0 mmHg AV VTI:            0.176 m AV Peak Grad:      5.2 mmHg AV Mean Grad:      3.0 mmHg LVOT Vmax:         65.90 cm/s LVOT Vmean:        46.500 cm/s LVOT VTI:          0.111 m LVOT/AV VTI ratio: 0.63  AORTA Ao Root diam: 3.10 cm MITRAL VALVE MV Area (PHT): 3.91 cm    SHUNTS MV Area VTI:  2.54 cm    Systemic VTI:  0.11 m MV Peak grad:  2.5 mmHg    Systemic Diam: 2.10 cm MV Mean grad:  1.0 mmHg MV Vmax:       0.80 m/s MV Vmean:      37.8 cm/s MV Decel Time: 194 msec MV E velocity: 75.85 cm/s MV A velocity: 25.30 cm/s MV E/A ratio:  3.00 Donnelly Angelica Electronically signed by Donnelly Angelica Signature Date/Time: 12/13/2021/12:51:16 PM    Final    US Abdomen Limited RUQ (LIVER/GB)  Result Date: 12/05/2021 CLINICAL DATA:  Elevated bilirubin EXAM: ULTRASOUND ABDOMEN LIMITED RIGHT UPPER QUADRANT COMPARISON:  CT a chest 12/05/2021 FINDINGS: Gallbladder:  The gallbladder is contracted. Gallbladder appears impacted with shadowing stones. The wall appears mildly thickened measuring up to 3 mm, though this is likely due to contraction. There is no pericholecystic fluid. Sonographic Murphy's sign was reported negative. Common bile duct: Diameter: 3 mm Liver: The liver is markedly heterogeneous in echotexture. The liver surface contour is nodular. Numerous heterogeneously echoic masses are seen corresponding to the low-density lesion seen on the prior CT chest. Portal vein is patent on color Doppler imaging with normal direction of blood flow towards the liver. Other: None. IMPRESSION: 1. Nodular surface contour suggesting a background of cirrhosis. 2. Numerous heterogeneously echoic masses consistent with metastatic disease as seen on the prior CTA chest. 3. Contracted gallbladder which is impacted with gallstones. Mild gallbladder wall thickening is likely due to contraction. There is no pericholecystic fluid, and sonographic Murphy's sign was reported negative. 4. No biliary ductal dilatation. Electronically Signed   By: Valetta Mole M.D.   On: 12/05/2021 11:30    PERFORMANCE STATUS (ECOG) : 3 - Symptomatic, >50% confined to bed  Review of Systems Unless otherwise noted, a complete review of systems is negative.  Physical Exam General: Frail appearing Pulmonary: Unlabored Abdomen: Protuberant Extremities: no edema Skin: no rashes Neurological: Weakness but otherwise nonfocal  IMPRESSION: I met with patient and daughter following their visit with Dr. Tasia Catchings.  Unfortunately, patient has not felt to have viable treatment options for stage IV pancreatic cancer with rapidly rising serum bilirubin.  His prognosis is felt to be limited to days to weeks.  We had a long conversation about comfort care at end-of-life and both patient and daughter are in agreement with involving hospice.  Daughter would like to rapidly transfer patient to an inpatient hospice facility  and this information was relayed to hospice at time of referral.  I do think that patient would meet the prognostic window for hospice IPU as he is expected to rapidly decline.  Symptomatically he has some abdominal pain.  We will start him on low-dose dexamethasone as there is likely capsular involvement from his innumerable liver metastases.  We will also start morphine elixir as part of patient's comfort meds under hospice.  Patient denies nausea but we will go ahead and send prescription for ondansetron to have on hand if needed.  Both patient and daughter were in agreement with DNR/DNI.  I sent him home with a son DNR order.  PLAN: -Comfort care -Referral for hospice -DNR/DNI -Start morphine elixir 5 to 10 mg every 2 hours as needed for pain/dyspnea #30 mL -Start dexamethasone 4 mg daily -Ondansetron as needed for nausea -RTC as needed  Case and plan discussed with Dr. Tasia Catchings.  Also called and gave report to hospice physician -Dr. Gilford Rile   Patient expressed understanding and was in agreement with this plan. He also understands  that He can call the clinic at any time with any questions, concerns, or complaints.     Time Total: 25 minutes  Visit consisted of counseling and education dealing with the complex and emotionally intense issues of symptom management and palliative care in the setting of serious and potentially life-threatening illness.Greater than 50%  of this time was spent counseling and coordinating care related to the above assessment and plan.  Signed by: Altha Harm, PhD, NP-C

## 2021-12-16 DIAGNOSIS — C787 Secondary malignant neoplasm of liver and intrahepatic bile duct: Secondary | ICD-10-CM | POA: Insufficient documentation

## 2021-12-16 NOTE — Progress Notes (Signed)
Hematology/Oncology Progress note [phone] [phone]      Patient Care Team: Kirk Ruths, MD as PCP - General (Internal Medicine) Clent Jacks, RN as Oncology Nurse Navigator Earlie Server, MD as Consulting Physician (Hematology and Oncology) Emmaline Kluver., MD as Consulting Physician (Rheumatology) Efrain Sella, MD as Consulting Physician (Gastroenterology)  REFERRING PROVIDER: Kirk Ruths, MD  CHIEF COMPLAINTS/REASON FOR VISIT:  Metastatic pancreatic adenocarcinoma  HISTORY OF PRESENTING ILLNESS:  Patient presented emergency room on 12/01/2021 for evaluation of shortness of breath with exertion.  Patient left without being seen. 12/04/2021 patient was seen by primary care provider Dr. Ouida Sills.  Blood work showed a total bilirubin level of 7.5, alkaline phosphatase 295, albumin 3.4, creatinine 1.5. 12/05/2021, CT angio chest pulmonary embolism protocol showed no PE.  There were multiple ill-defined hepatic lobe densities consistent with hepatic metastatic disease.   12/05/2022, right upper quadrant abdomen ultrasound showed nodular surface contour suggesting background of cirrhosis.  Numerous heterogeneous liver masses consistent with metastatic disease.  Contracted gallbladder.  No bile duct dilatation. Patient reports unintentional weight loss of 20 pounds within the past few months.  He was accompanied by his friend [place].  Denies abdominal pain.  Appetite is fair.  Normal bowel movement.  No nausea vomiting diarrhea. Family history of mother passed away from lung cancer in 2010.  Denies any other family history of cancer.  Denies any black or bloody bowel movement.  INTERVAL HISTORY Ivan Pope is a 71 y.o. male who has above history reviewed by me today presents for follow up visit for newly diagnosed metastatic pancreatic adenocarcinoma. During the interval, patient had MRI done which showed large pancreatic mass of the  distal tail of pancreas invading splenic hilum invading the capsule overlying the upper pole of the left kidney.  Innumerable peripherally enhancing lesions scattered throughout both lobes of liver.Mild intrahepatic bile duct dilatation, No common bile duct dilatation identified.  Patient's labs showed bilirubin of 19.  He was admitted to expedite work-up.  12/12/2021,  Ultrasound-guided liver biopsy showed adenocarcinoma, consistent with pancreatic origin.Marland Kitchen  He was evaluated by GI and IR.  Hyperbilirubinemia is secondary to intrahepatic obstruction which is not stainable or drainable.  Today patient was accompanied by his daughter.  Patient reports having abdominal pain.  Review of Systems  Constitutional:  Positive for appetite change, fatigue and unexpected weight change. Negative for chills and fever.  HENT:   Negative for hearing loss and voice change.   Eyes:  Negative for eye problems and icterus.  Respiratory:  Positive for shortness of breath. Negative for chest tightness and cough.   Cardiovascular:  Negative for chest pain and leg swelling.  Gastrointestinal:  Positive for abdominal pain. Negative for abdominal distention.  Endocrine: Negative for hot flashes.  Genitourinary:  Negative for difficulty urinating, dysuria and frequency.   Musculoskeletal:  Negative for arthralgias.  Skin:  Negative for itching and rash.  Neurological:  Negative for light-headedness and numbness.  Hematological:  Negative for adenopathy. Does not bruise/bleed easily.  Psychiatric/Behavioral:  Negative for confusion.    MEDICAL HISTORY:  Past Medical History:  Diagnosis Date   Adenomatous polyp 2014   Elevated bilirubin    Erectile dysfunction    Fatigue    Gallstones    Gout    Hypertension    Liver cancer ([place]) 2023   suspected   Prediabetes    Snoring     SURGICAL HISTORY: Past Surgical History:  Procedure Laterality Date  COLONOSCOPY  2014   Dr. Mamie Nick. Oh @ [facility] - Adenomatous Polyp, rpt  5 yrs per [facility]   MOUTH SURGERY      SOCIAL HISTORY: Social History   Socioeconomic History   Marital status: Single    Spouse name: Not on file   Number of children: Not on file   Years of education: Not on file   Highest education level: Not on file  Occupational History   Not on file  Tobacco Use   Smoking status: Never    Passive exposure: Never   Smokeless tobacco: Never  Vaping Use   Vaping Use: Never used  Substance and Sexual Activity   Alcohol use: Not Currently   Drug use: Not on file   Sexual activity: Not on file  Other Topics Concern   Not on file  Social History Narrative   Not on file   Social Determinants of Health   Financial Resource Strain: Not on file  Food Insecurity: Not on file  Transportation Needs: Not on file  Physical Activity: Not on file  Stress: Not on file  Social Connections: Not on file  Intimate Partner Violence: Not on file    FAMILY HISTORY: Family History  Problem Relation Age of Onset   Lung cancer Mother    Hypertension Daughter     ALLERGIES:  has No Known Allergies.  MEDICATIONS:  Current Outpatient Medications  Medication Sig Dispense Refill   allopurinol (ZYLOPRIM) 100 MG tablet TAKE 1 TABLET BY MOUTH ONCE DAILY (TAKE  WITH  300MG   TABLET  DAILY  FOR  A  TOTAL  DAILY  DOSE  OF  400MG )     allopurinol (ZYLOPRIM) 300 MG tablet TAKE 1 TABLET BY MOUTH ONCE DAILY (TAKE ALONG WITH 100MG  TABLET)     carvedilol (COREG) 25 MG tablet Take 1 tablet (25 mg total) by mouth 2 (two) times daily. 60 tablet 2   feeding supplement (ENSURE ENLIVE / ENSURE PLUS) LIQD Take 237 mLs by mouth 3 (three) times daily between meals.     lisinopril (ZESTRIL) 40 MG tablet Hold until PCP followup since your blood pressure has been low normal.     spironolactone (ALDACTONE) 25 MG tablet Hold until PCP followup since your blood pressure has been low normal.     tadalafil (CIALIS) 5 MG tablet Take 1 tablet (5 mg total) by mouth daily as needed for  erectile dysfunction. Home med. 10 tablet 0   dexamethasone (DECADRON) 4 MG tablet Take 1 tablet (4 mg total) by mouth daily. 15 tablet 0   Morphine Sulfate (MORPHINE CONCENTRATE) 10 mg / 0.5 ml concentrated solution Take 0.25-0.5 mLs (5-10 mg total) by mouth every 2 (two) hours as needed for severe pain. 30 mL 0   ondansetron (ZOFRAN) 8 MG tablet Take 1 tablet (8 mg total) by mouth every 8 (eight) hours as needed for nausea or vomiting. 20 tablet 0   No current facility-administered medications for this visit.     PHYSICAL EXAMINATION: ECOG PERFORMANCE STATUS: 2 - Symptomatic, <50% confined to bed Vitals:   12/15/21 1154  BP: 91/62  Pulse: 72  Temp: (!) 96.6 F (35.9 C)  SpO2: 97%   Filed Weights   12/15/21 1154  Weight: 228 lb (103.4 kg)    Physical Exam Constitutional:      General: He is not in acute distress. HENT:     Head: Normocephalic and atraumatic.  Eyes:     General: Scleral icterus present.  Cardiovascular:  Rate and Rhythm: Normal rate and regular rhythm.     Heart sounds: Normal heart sounds.  Pulmonary:     Effort: Pulmonary effort is normal. No respiratory distress.     Breath sounds: No wheezing.  Abdominal:     General: Bowel sounds are normal. There is no distension.     Palpations: Abdomen is soft.  Musculoskeletal:        General: No deformity. Normal range of motion.     Cervical back: Normal range of motion and neck supple.  Skin:    General: Skin is warm and dry.     Findings: No erythema or rash.  Neurological:     Mental Status: He is alert and oriented to person, place, and time. Mental status is at baseline.     Cranial Nerves: No cranial nerve deficit.     Coordination: Coordination normal.  Psychiatric:        Mood and Affect: Mood normal.    LABORATORY DATA:  I have reviewed the data as listed Lab Results  Component Value Date   WBC 7.3 12/14/2021   HGB 14.2 12/14/2021   HCT RESULTS UNAVAILABLE DUE TO INTERFERING SUBSTANCE  12/14/2021   MCV RESULTS UNAVAILABLE DUE TO INTERFERING SUBSTANCE 12/14/2021   PLT 159 12/14/2021   Recent Labs    12/11/21 1158 12/12/21 0534 12/13/21 0520 12/14/21 0539  NA 130* 131* 132* 130*  K 4.5 4.4 5.1 4.6  CL 96* 98 98 98  CO2 26 25 24 24   GLUCOSE 142* 116* 102* 103*  BUN 20 25* 26* 33*  CREATININE 1.13 1.17 1.24 UNABLE TO REPORT DUE TO ICTERUS/HKP  CALCIUM 8.6* 8.7* 8.9 8.9  GFRNONAA >60 >60 >60 NOT CALCULATED  PROT 7.6 6.9 6.3* 6.5  ALBUMIN 2.7* 2.5* 2.4* 2.3*  AST 37 35 36 36  ALT 35 33 30 29  ALKPHOS 225* 214* 196* 202*  BILITOT 19.8* 19.8* 21.1* 22.9*  BILIDIR 14.3*  --   --   --   IBILI 5.5*  --   --   --     Iron/TIBC/Ferritin/ %Sat    Component Value Date/Time   IRON 101 12/11/2021 1158   TIBC 238 (L) 12/11/2021 1158   FERRITIN 797 (H) 12/11/2021 1158   IRONPCTSAT 42 (H) 12/11/2021 1158       RADIOGRAPHIC STUDIES: I have personally reviewed the radiological images as listed and agreed with the findings in the report. DG Chest 2 View  Result Date: 12/01/2021 CLINICAL DATA:  Shortness of breath with exertion. EXAM: CHEST - 2 VIEW COMPARISON:  None. FINDINGS: The lungs are clear without focal pneumonia, edema, pneumothorax or pleural effusion. Cardiopericardial silhouette is at upper limits of normal for size. There is pulmonary vascular congestion without overt pulmonary edema. Streaky opacity at the left base suggest atelectasis. The visualized bony structures of the thorax show no acute abnormality. IMPRESSION: Pulmonary vascular congestion without overt pulmonary edema. Electronically Signed   By: Misty Stanley M.D.   On: 12/01/2021 15:08   CT Angio Chest Pulmonary Embolism (PE) W or WO Contrast  Result Date: 12/05/2021 CLINICAL DATA:  Shortness of breath. EXAM: CT ANGIOGRAPHY CHEST WITH CONTRAST TECHNIQUE: Multidetector CT imaging of the chest was performed using the standard protocol during bolus administration of intravenous contrast. Multiplanar  CT image reconstructions and MIPs were obtained to evaluate the vascular anatomy. RADIATION DOSE REDUCTION: This exam was performed according to the departmental dose-optimization program which includes automated exposure control, adjustment of the mA and/or kV  according to patient size and/or use of iterative reconstruction technique. CONTRAST:  62mL OMNIPAQUE IOHEXOL 350 MG/ML SOLN COMPARISON:  December 01, 2021. FINDINGS: Cardiovascular: Satisfactory opacification of the pulmonary arteries to the segmental level. No evidence of pulmonary embolism. Normal heart size. No pericardial effusion. Atherosclerosis of thoracic aorta is noted without aneurysm formation. Mediastinum/Nodes: No enlarged mediastinal, hilar, or axillary lymph nodes. Thyroid gland, trachea, and esophagus demonstrate no significant findings. Lungs/Pleura: No pneumothorax or pleural effusion is noted. 11 x 10 mm ill-defined sub solid density is noted in right lower lobe. Mild left posterior basilar subsegmental atelectasis is noted. Upper Abdomen: Multiple ill-defined low densities are noted throughout hepatic parenchyma consistent with metastatic disease. Musculoskeletal: No chest wall abnormality. No acute or significant osseous findings. Review of the MIP images confirms the above findings. IMPRESSION: No definite evidence of pulmonary embolus. Multiple ill-defined hepatic low densities are noted consistent with hepatic metastatic disease. These results will be called to the ordering clinician or representative by the Radiologist Assistant, and communication documented in the PACS or zVision Dashboard. 10 x 11 mm ill-defined sub solid density is noted in right lower lobe. Initial follow-up by chest CT without contrast is recommended in 3 months to confirm persistence. This recommendation follows the consensus statement: Recommendations for the Management of Subsolid Pulmonary Nodules Detected at CT: A Statement from the [place] as  published in Radiology 2013; 266:304-317. Aortic Atherosclerosis (ICD10-I70.0). Electronically Signed   By: Marijo Conception M.D.   On: 12/05/2021 10:53   MR Abdomen W Wo Contrast  Result Date: 12/11/2021 CLINICAL DATA:  Evaluate liver lesions identified on recent CT. EXAM: MRI ABDOMEN WITHOUT AND WITH CONTRAST TECHNIQUE: Multiplanar multisequence MR imaging of the abdomen was performed both before and after the administration of intravenous contrast. CONTRAST:  37mL GADAVIST GADOBUTROL 1 MMOL/ML IV SOLN COMPARISON:  12/05/2021 FINDINGS: Lower chest: Trace left pleural effusion Hepatobiliary: There are innumerable, peripherally enhancing, T2 hyperintense and T1 hypointense lesions scattered throughout both lobes of liver compatible with liver metastases. Lesions are too numerous to count, including: Index lesion within segment 2 measures 2.9 x 2.4 cm, image 26/5. Index lesion within segment 3 measures 2.6 x 2.2 cm, image 42/15. Index lesion within segment 6 measures 2.1 x 2.2 cm, image 53/15. Index lesion within segment 7/8 measures 2.8 x 1.9 cm, image 23/15. Index lesion central aspect of the liver there is a 2.7 by 1.9 cm, image 40/17. This results in central intrahepatic biliary dilatation with mild dilatation of the peripheral biliary radicles in the right hepatic lobe. Gallbladder appears collapsed with a stone identified within the fundus. The CBD has a normal caliber. There is dilatation of the peripheral biliary radicles within both lobes of liver likely reflecting mass effect from the diffuse liver metastases. Pancreas: Mass arising off the distal tail of pancreas invades the splenic hilum. The tumor also extends to involve the capsule overlying the upper pole of the left kidney, image 26/19. This measures 3.7 by 3.8 cm, image 45/13 and is highly concerning for primary pancreatic adenocarcinoma. No signs of pancreatic inflammation or main duct dilatation. Spleen: Tail of pancreas mass is seen invading  the splenic hilum, image 24/19. Adrenals/Urinary Tract: There is a peripherally enhancing nodule involving the right adrenal gland measuring 1.6 cm, image 39/20. There is loss of signal within this nodule between the inphase and out of phase sequences compatible with a benign adenoma. Hypertrophy of the left adrenal gland noted without discrete mass. Multiple small bilateral kidney cysts are  identified, which are technically too small to reliably characterize. No signs of hydronephrosis or mass. Stomach/Bowel: Visualized portions within the abdomen are unremarkable. Vascular/Lymphatic: No pathologically enlarged lymph nodes identified. No abdominal aortic aneurysm demonstrated. Aortic atherosclerosis. Other:  No free fluid or fluid collections identified. Musculoskeletal: No suspicious bone lesions identified. IMPRESSION: 1. There is a large mass arising off the distal tail of pancreas invading the splenic hilum and invading the capsule overlying the upper pole of the left kidney. This is highly concerning for primary pancreatic adenocarcinoma. 2. Innumerable, peripherally enhancing lesions scattered throughout both lobes of liver compatible with liver metastases. 3. Mild intrahepatic bile duct dilatation secondary to mass effect from central liver metastases. No common bile duct dilatation identified. 4. Right adrenal gland adenoma. These results will be called to the ordering clinician or representative by the Radiologist Assistant, and communication documented in the PACS or [employer]. Electronically Signed   By: Kerby Moors M.D.   On: 12/11/2021 13:16   US BIOPSY (LIVER)  Result Date: 12/12/2021 INDICATION: 71 year old gentleman with pancreatic mass and multiple hepatic metastatic lesions presents to IR for ultrasound-guided biopsy. EXAM: Ultrasound-guided biopsy of left hepatic mass. MEDICATIONS: None. ANESTHESIA/SEDATION: Moderate (conscious) sedation was employed during this procedure. A total  of Versed 1 mg and Fentanyl 50 mcg was administered intravenously. Moderate Sedation Time: 10 minutes. The patient's level of consciousness and vital signs were monitored continuously by radiology nursing throughout the procedure under my direct supervision. COMPLICATIONS: None immediate. PROCEDURE: Informed written consent was obtained from the patient after a thorough discussion of the procedural risks, benefits and alternatives. All questions were addressed. Maximal Sterile Barrier Technique was utilized including caps, mask, sterile gowns, sterile gloves, sterile drape, hand hygiene and skin antiseptic. A timeout was performed prior to the initiation of the procedure. Patient position supine on the ultrasound table. Epigastric skin prepped and draped in usual sterile fashion. Following local lidocaine administration, 17 gauge introducer needle was advanced into 1 of the left hepatic lobe lesion, and 4- 18 gauge cores were obtained utilizing continuous ultrasound guidance. Gelfoam slurry was administered through the introducer needle at the biopsy site. Samples were sent to pathology in formalin. Needle removed and hemostasis achieved with 5 minutes of manual compression. Post procedure ultrasound images showed no evidence of significant hemorrhage. IMPRESSION: Ultrasound-guided biopsy of left hepatic lesion as above. Electronically Signed   By: Miachel Roux M.D.   On: 12/12/2021 15:45   ECHOCARDIOGRAM COMPLETE  Result Date: 12/13/2021    ECHOCARDIOGRAM REPORT   Patient Name:   DHANI DANNEMILLER Date of Exam: 12/13/2021 Medical Rec #:  [id number]       Height:       71.0 in Accession #:    [id number]      Weight:       211.0 lb Date of Birth:  Oct 26, 1951       BSA:          2.157 m Patient Age:    71 years        BP:           99/58 mmHg Patient Gender: M               HR:           79 bpm. Exam Location:  [facility] Procedure: 2D Echo, Color Doppler, Cardiac Doppler and Intracardiac            Opacification Agent  Indications:     I48.91 Atrial fibrillation  History:  Patient has no prior history of Echocardiogram examinations.                  Risk Factors:Hypertension.  Sonographer:     Charmayne Sheer Referring Phys:  [id number] [place] TANG Diagnosing Phys: Donnelly Angelica  Sonographer Comments: Suboptimal apical window and suboptimal subcostal window. Image acquisition challenging due to patient body habitus. IMPRESSIONS  1. Left ventricular ejection fraction, by estimation, is 50 to 55%. The left ventricle has low normal function. The left ventricle has no regional wall motion abnormalities. There is mild left ventricular hypertrophy. Left ventricular diastolic parameters are indeterminate.  2. Right ventricular systolic function was not well visualized. The right ventricular size is not well visualized.  3. The mitral valve is normal in structure. No evidence of mitral valve regurgitation. No evidence of mitral stenosis.  4. The aortic valve is normal in structure. Aortic valve regurgitation is not visualized. No aortic stenosis is present. FINDINGS  Left Ventricle: Left ventricular ejection fraction, by estimation, is 50 to 55%. The left ventricle has low normal function. The left ventricle has no regional wall motion abnormalities. Definity contrast agent was given IV to delineate the left ventricular endocardial borders. The left ventricular internal cavity size was normal in size. There is mild left ventricular hypertrophy. Left ventricular diastolic parameters are indeterminate. Right Ventricle: The right ventricular size is not well visualized. Right vetricular wall thickness was not well visualized. Right ventricular systolic function was not well visualized. Left Atrium: Left atrial size was normal in size. Right Atrium: Right atrial size was normal in size. Pericardium: There is no evidence of pericardial effusion. Mitral Valve: The mitral valve is normal in structure. No evidence of mitral valve  regurgitation. No evidence of mitral valve stenosis. MV peak gradient, 2.5 mmHg. The mean mitral valve gradient is 1.0 mmHg. Tricuspid Valve: The tricuspid valve is not well visualized. Tricuspid valve regurgitation is trivial. No evidence of tricuspid stenosis. Aortic Valve: The aortic valve is normal in structure. Aortic valve regurgitation is not visualized. No aortic stenosis is present. Aortic valve mean gradient measures 3.0 mmHg. Aortic valve peak gradient measures 5.2 mmHg. Aortic valve area, by VTI measures 2.18 cm. Pulmonic Valve: The pulmonic valve was not well visualized. Pulmonic valve regurgitation is not visualized. No evidence of pulmonic stenosis. Aorta: The aortic root is normal in size and structure. Venous: The inferior vena cava was not well visualized. IAS/Shunts: No atrial level shunt detected by color flow Doppler.  LEFT VENTRICLE PLAX 2D LVIDd:         5.14 cm   Diastology LVIDs:         4.02 cm   LV e' medial:    9.90 cm/s LV PW:         1.12 cm   LV E/e' medial:  7.7 LV IVS:        1.13 cm   LV e' lateral:   10.30 cm/s LVOT diam:     2.10 cm   LV E/e' lateral: 7.4 LV SV:         38 LV SV Index:   18 LVOT Area:     3.46 cm  LEFT ATRIUM         Index LA diam:    4.10 cm 1.90 cm/m  AORTIC VALVE                    PULMONIC VALVE AV Area (Vmax):    2.00 cm  PV Vmax:       0.85 m/s AV Area (Vmean):   2.02 cm     PV Vmean:      57.100 cm/s AV Area (VTI):     2.18 cm     PV VTI:        0.130 m AV Vmax:           114.00 cm/s  PV Peak grad:  2.9 mmHg AV Vmean:          79.700 cm/s  PV Mean grad:  1.0 mmHg AV VTI:            0.176 m AV Peak Grad:      5.2 mmHg AV Mean Grad:      3.0 mmHg LVOT Vmax:         65.90 cm/s LVOT Vmean:        46.500 cm/s LVOT VTI:          0.111 m LVOT/AV VTI ratio: 0.63  AORTA Ao Root diam: 3.10 cm MITRAL VALVE MV Area (PHT): 3.91 cm    SHUNTS MV Area VTI:   2.54 cm    Systemic VTI:  0.11 m MV Peak grad:  2.5 mmHg    Systemic Diam: 2.10 cm MV Mean grad:  1.0  mmHg MV Vmax:       0.80 m/s MV Vmean:      37.8 cm/s MV Decel Time: 194 msec MV E velocity: 75.85 cm/s MV A velocity: 25.30 cm/s MV E/A ratio:  3.00 Donnelly Angelica Electronically signed by Donnelly Angelica Signature Date/Time: 12/13/2021/12:51:16 PM    Final    US Abdomen Limited RUQ (LIVER/GB)  Result Date: 12/05/2021 CLINICAL DATA:  Elevated bilirubin EXAM: ULTRASOUND ABDOMEN LIMITED RIGHT UPPER QUADRANT COMPARISON:  CT a chest 12/05/2021 FINDINGS: Gallbladder: The gallbladder is contracted. Gallbladder appears impacted with shadowing stones. The wall appears mildly thickened measuring up to 3 mm, though this is likely due to contraction. There is no pericholecystic fluid. Sonographic Murphy's sign was reported negative. Common bile duct: Diameter: 3 mm Liver: The liver is markedly heterogeneous in echotexture. The liver surface contour is nodular. Numerous heterogeneously echoic masses are seen corresponding to the low-density lesion seen on the prior CT chest. Portal vein is patent on color Doppler imaging with normal direction of blood flow towards the liver. Other: None. IMPRESSION: 1. Nodular surface contour suggesting a background of cirrhosis. 2. Numerous heterogeneously echoic masses consistent with metastatic disease as seen on the prior CTA chest. 3. Contracted gallbladder which is impacted with gallstones. Mild gallbladder wall thickening is likely due to contraction. There is no pericholecystic fluid, and sonographic Murphy's sign was reported negative. 4. No biliary ductal dilatation. Electronically Signed   By: Valetta Mole M.D.   On: 12/05/2021 11:30      ASSESSMENT & PLAN:  1. Liver metastasis ([place])   2. Pancreatic adenocarcinoma ([place])   3. Goals of care, counseling/discussion    Cancer Staging  Pancreatic carcinoma metastatic to liver [facility]) Staging form: Exocrine Pancreas, AJCC 8th Edition - Clinical stage from 12/15/2021: Stage IV (cT2, cN0, pM1) - Signed by Earlie Server, MD on  12/16/2021   #Primary pancreatic adenocarcinoma with extensive liver metastasis, hyperbilirubinemia Prognosis is extremely poor. Image findings and pathology reports were reviewed and discussed with patient and his daughter. Hyperbilirubinemia is due to intrahepatic obstruction, not stent above or drainable.  Bilirubin is over 20. Even though gemcitabine could potentially be given at a lower dose, I doubt treatment will  be of any benefit.  Potential side effects "further decrease his current quality of life.  Hospice/comfort care was recommended.  Patient and daughter are in agreement. Refer to palliative care service.  Will meet with Altha Harm today.  #Neoplasm related pain, recommend morphine. Orders Placed This Encounter  Procedures   Ambulatory referral to Genetics    Referral Priority:   Urgent    Referral Type:   Consultation    Referral Reason:   Specialty Services Required    Referred to Provider:   Faith Rogue T    Number of Visits Requested:   1    All questions were answered. The patient knows to call the clinic with any problems questions or concerns.  cc Kirk Ruths, MD   Thank you for this kind referral and the opportunity to participate in the care of this patient. A copy of today's note is routed to referring provider   Earlie Server, MD, PhD [facility] Hematology Oncology 12/16/2021

## 2021-12-24 ENCOUNTER — Inpatient Hospital Stay (HOSPITAL_COMMUNITY)
Admission: EM | Admit: 2021-12-24 | Discharge: 2021-12-25 | DRG: 435 | Disposition: A | Discharge status: Hospice | Attending: Internal Medicine | Admitting: Internal Medicine

## 2021-12-24 ENCOUNTER — Other Ambulatory Visit: Payer: Self-pay

## 2021-12-24 DIAGNOSIS — K7200 Acute and subacute hepatic failure without coma: Secondary | ICD-10-CM | POA: Diagnosis present

## 2021-12-24 DIAGNOSIS — Z79899 Other long term (current) drug therapy: Secondary | ICD-10-CM

## 2021-12-24 DIAGNOSIS — I10 Essential (primary) hypertension: Secondary | ICD-10-CM | POA: Diagnosis present

## 2021-12-24 DIAGNOSIS — Z8249 Family history of ischemic heart disease and other diseases of the circulatory system: Secondary | ICD-10-CM

## 2021-12-24 DIAGNOSIS — C259 Malignant neoplasm of pancreas, unspecified: Principal | ICD-10-CM | POA: Diagnosis present

## 2021-12-24 DIAGNOSIS — G9341 Metabolic encephalopathy: Secondary | ICD-10-CM | POA: Diagnosis present

## 2021-12-24 DIAGNOSIS — Z515 Encounter for palliative care: Secondary | ICD-10-CM

## 2021-12-24 DIAGNOSIS — C787 Secondary malignant neoplasm of liver and intrahepatic bile duct: Secondary | ICD-10-CM | POA: Diagnosis present

## 2021-12-24 DIAGNOSIS — Z801 Family history of malignant neoplasm of trachea, bronchus and lung: Secondary | ICD-10-CM

## 2021-12-24 DIAGNOSIS — R5383 Other fatigue: Secondary | ICD-10-CM | POA: Diagnosis not present

## 2021-12-24 DIAGNOSIS — R180 Malignant ascites: Secondary | ICD-10-CM | POA: Diagnosis present

## 2021-12-24 DIAGNOSIS — I480 Paroxysmal atrial fibrillation: Secondary | ICD-10-CM | POA: Diagnosis present

## 2021-12-24 DIAGNOSIS — Z66 Do not resuscitate: Secondary | ICD-10-CM | POA: Diagnosis present

## 2021-12-24 DIAGNOSIS — Z20822 Contact with and (suspected) exposure to covid-19: Secondary | ICD-10-CM | POA: Diagnosis present

## 2021-12-24 DIAGNOSIS — I445 Left posterior fascicular block: Secondary | ICD-10-CM | POA: Diagnosis present

## 2021-12-24 DIAGNOSIS — N179 Acute kidney failure, unspecified: Secondary | ICD-10-CM | POA: Diagnosis present

## 2021-12-24 DIAGNOSIS — R7303 Prediabetes: Secondary | ICD-10-CM | POA: Diagnosis present

## 2021-12-24 LAB — CBC WITH DIFFERENTIAL/PLATELET

## 2021-12-24 NOTE — ED Provider Notes (Signed)
[facility] EMERGENCY DEPARTMENT Provider Note   CSN: [id number] Arrival date & time: 12/24/21  2153     History = Chief Complaint  Patient presents with   Fatigue    Ivan Pope is a 71 y.o. male who has an extensive pmh including gout, hypertension, and cholelithiasis. He has a diagnosis of adenocarcinoma of the pancreas recent admission from 12/11/2021 to 12/14/2021  weakness, poor appetite, and jaundice. MRI on 1/30 showed a large mass in the tail of the pancreas invading the splenic hilum and upper pole of the left kidney,with  liver metastases, intrahepatic bile duct dilation secondary to mass-effect.  He presents tonight with similar sxs including weakness, fatigue and loss of appetite. Patient is very fatigues and sob, he states that he is feeling very anxious as well. He thinks his abdomen may be more distended than normal.     HPI     Home Medications Prior to Admission medications   Medication Sig Start Date End Date Taking? Authorizing Provider  feeding supplement (ENSURE ENLIVE / ENSURE PLUS) LIQD Take 237 mLs by mouth 3 (three) times daily between meals. 12/13/21  Yes Enzo Bi, MD  Morphine Sulfate (MORPHINE CONCENTRATE) 10 mg / 0.5 ml concentrated solution Take 0.25-0.5 mLs (5-10 mg total) by mouth every 2 (two) hours as needed for severe pain. 12/15/21  Yes Borders, Kirt Boys, NP  ondansetron (ZOFRAN) 8 MG tablet Take 1 tablet (8 mg total) by mouth every 8 (eight) hours as needed for nausea or vomiting. 12/15/21  Yes Borders, Kirt Boys, NP  morphine 1 mg/mL SOLN infusion Inject 1 mg/hr into the vein continuous. 12/25/21   Terrilee Croak, MD  ondansetron (ZOFRAN) 4 MG/2ML SOLN injection Inject 2 mLs (4 mg total) into the vein every 6 (six) hours as needed for nausea. 12/25/21   Terrilee Croak, MD      Allergies    Patient has no known allergies.    Review of Systems   Review of Systems  Physical Exam Updated Vital Signs BP 98/74 (BP Location: Left  Arm)    Pulse 86    Temp (!) 97.5 F (36.4 C) (Oral)    Resp 18    SpO2 93%  Physical Exam Constitutional:      Appearance: He is obese. He is ill-appearing.  HENT:     Head: Normocephalic and atraumatic.     Nose: Nose normal.  Eyes:     General: Scleral icterus present.     Extraocular Movements: Extraocular movements intact.     Pupils: Pupils are equal, round, and reactive to light.  Cardiovascular:     Rate and Rhythm: Normal rate.  Pulmonary:     Effort: Tachypnea present.     Breath sounds: Decreased air movement present.  Abdominal:     General: There is distension (tight distension).     Palpations: There is fluid wave.  Skin:    Coloration: Skin is jaundiced.  Neurological:     Mental Status: He is oriented to person, place, and time. He is lethargic.    ED Results / Procedures / Treatments   Labs (all labs ordered are listed, but only abnormal results are displayed) Labs Reviewed  COMPREHENSIVE METABOLIC PANEL - Abnormal; Notable for the following components:      Result Value   Potassium 5.4 (*)    Glucose, Bld 254 (*)    BUN 65 (*)    Creatinine, Ser 1.86 (*)    Total Protein 6.1 (*)  Albumin 2.0 (*)    AST 108 (*)    ALT 141 (*)    Alkaline Phosphatase 362 (*)    Total Bilirubin 42.4 (*)    GFR, Estimated 38 (*)    All other components within normal limits  CBC WITH DIFFERENTIAL/PLATELET - Abnormal; Notable for the following components:   WBC 17.0 (*)    MCV 77.9 (*)    MCHC 37.2 (*)    RDW 22.1 (*)    Platelets 97 (*)    nRBC 0.9 (*)    Neutro Abs 15.1 (*)    Lymphs Abs 0.6 (*)    Abs Immature Granulocytes 0.24 (*)    All other components within normal limits  LIPASE, BLOOD - Abnormal; Notable for the following components:   Lipase 54 (*)    All other components within normal limits  PROTIME-INR - Abnormal; Notable for the following components:   Prothrombin Time 21.7 (*)    INR 1.9 (*)    All other components within normal limits  LACTIC  ACID, PLASMA - Abnormal; Notable for the following components:   Lactic Acid, Venous 2.1 (*)    All other components within normal limits  COMPREHENSIVE METABOLIC PANEL - Abnormal; Notable for the following components:   Potassium 5.2 (*)    Glucose, Bld 177 (*)    BUN 70 (*)    Creatinine, Ser 1.95 (*)    Calcium 8.6 (*)    Total Protein 5.3 (*)    Albumin 1.8 (*)    AST 111 (*)    ALT 142 (*)    Alkaline Phosphatase 365 (*)    Total Bilirubin 39.3 (*)    GFR, Estimated 36 (*)    All other components within normal limits  CBC - Abnormal; Notable for the following components:   WBC 17.6 (*)    HCT 37.8 (*)    MCV 79.6 (*)    MCHC 36.2 (*)    RDW 22.0 (*)    Platelets 79 (*)    nRBC 0.8 (*)    All other components within normal limits  RESP PANEL BY RT-PCR (FLU A&B, COVID) [id number]  AMMONIA    EKG EKG Interpretation  Date/Time:  Sunday December 24 2021 22:01:32 EST Ventricular Rate:  72 PR Interval:    QRS Duration: 93 QT Interval:  365 QTC Calculation: 400 R Axis:   175 Text Interpretation: Atrial fibrillation Left posterior fascicular block Abnormal R-wave progression, late transition Inferior infarct, old Lead(s) V1 were not used for morphology analysis Confirmed by Ripley Fraise [phone]) on 12/25/2021 9:46:44 AM  Radiology CT ABDOMEN PELVIS WO CONTRAST  Result Date: 12/25/2021 CLINICAL DATA:  Ascites. diagnosed with Pancreatic Cancer and daughter has noticed that pt has been weak, not wanting to eat, SHOB on exertion. Abdomen noted to be distended and rigid, Denies any pain. Pt A/O x4. EXAM: CT ABDOMEN AND PELVIS WITHOUT CONTRAST TECHNIQUE: Multidetector CT imaging of the abdomen and pelvis was performed following the standard protocol without IV contrast. RADIATION DOSE REDUCTION: This exam was performed according to the departmental dose-optimization program which includes automated exposure control, adjustment of the mA and/or kV according to patient size and/or use  of iterative reconstruction technique. COMPARISON:  MRI abdomen 12/11/2021, CT angiography chest 12/05/2021 FINDINGS: Lower chest: Redemonstration of right lower lobe 1.3 x 1.2 cm ground-glass airspace opacity. Linear atelectasis versus scarring. Question left base pulmonary nodules measuring 0.4 cm and 1.2 cm Hepatobiliary: Innumerable hypodense hepatic lesions that are better evaluated on  MRI abdomen 12/11/2021. Cholelithiasis. Likely contracted gallbladder with associated motion artifact. Markedly limited evaluation for gallbladder wall thickening or pericholecystic fluid. No biliary dilatation. Pancreas: Vague hypodensity in fullness along the pancreatic tail consistent with known pancreatic mass that is better evaluated on MR abdomen 12/11/2021 (3:27). The lesion is noted to abut the splenic hilum. Otherwise normal pancreatic contour. No surrounding inflammatory changes. No main pancreatic ductal dilatation. Spleen: Normal in size without focal abnormality. Adrenals/Urinary Tract: Vague pericentimeter right adrenal gland nodule consistent with reported adenoma on MRI. No left adrenal nodularity. No nephrolithiasis and no hydronephrosis. No definite contour-deforming renal mass. No ureterolithiasis or hydroureter. The urinary bladder is unremarkable. Stomach/Bowel: Stomach is within normal limits. No evidence of bowel wall thickening or dilatation. Scattered colonic diverticulosis. Appendix appears normal. Vascular/Lymphatic: No abdominal aorta aneurysm. The right common iliac artery is enlarged in caliber measuring up to 1.6 cm. The left common iliac artery measures at the upper limits of normal: 1.5 cm. Severe calcified and noncalcified atherosclerotic plaque of the aorta and its branches. No abdominal, pelvic, or inguinal lymphadenopathy. Reproductive: Prostate is enlarged in caliber measuring up to 5.6 cm. Other: Trace perihepatic simple free fluid. Trace free fluid within the pelvis. No intraperitoneal free  gas. No organized fluid collection. Musculoskeletal: No abdominal wall hernia or abnormality. No suspicious lytic or blastic osseous lesions. No acute displaced fracture. Multilevel degenerative changes of the spine. IMPRESSION: 1. Trace perihepatic and pelvic ascites. 2. Innumerable hepatic metastases that are better evaluated on MRI 12/11/2021 in a patient with known pancreatic cancer. 3. Question left base pulmonary nodules measuring 0.4 cm and 1.2 cm. Redemonstration of right lower lobe 1.3 x 1.2 cm ground-glass airspace opacity. 4. Cholelithiasis. Otherwise markedly limited evaluation of the gallbladder. If clinically indicated, consider right upper quadrant ultrasound for a more sensitive evaluation of the gallbladder. 5. Scattered colonic diverticulosis with no acute diverticulitis. 6. Prostatomegaly. 7. 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 8.  Aortic Atherosclerosis (ICD10-I70.0). Electronically Signed   By: Iven Finn M.D.   On: 12/25/2021 01:56   US Abdomen Complete  Result Date: 12/25/2021 CLINICAL DATA:  Acute liver failure, with known pancreatic cancer and numerous hepatic metastases. EXAM: ABDOMEN ULTRASOUND COMPLETE COMPARISON:  CT without contrast earlier today. FINDINGS: Gallbladder: The gallbladder partially contracted. There are multiple layering stones, largest is 1.4 cm. There is circumferential wall thickening to 9.4 mm. Negative sonographic Murphy sign and no pericholecystic fluid. Common bile duct: Diameter: 6.9 mm. Liver: Again noted is hepatic enlargement with numerous hypodense masses consistent with metastases, largest circumscribable lesion is 3.7 cm in the right lobe. Portal vein is patent on color Doppler imaging with normal direction of blood flow towards the liver. IVC: No abnormality visualized. Pancreas: Obscured by bowel gas. Spleen: Size and appearance within normal limits. Right Kidney: Length: 10.0 cm.  Echogenicity is increased. No mass or hydronephrosis visualized. Left Kidney: Length: 10.5 cm. Echogenicity is increased . No mass or hydronephrosis visualized. Abdominal aorta: No aneurysm visualized. Moderate echogenic calcific plaque. Other findings: Small-volume perihepatic ascites. IMPRESSION: 1. Numerous hepatic metastases with hepatic enlargement. 2. Marked circumferential gallbladder thickening, with layering stones. The wall thickening is probably due to hepatic dysfunction or congestive, usually is not due to cholecystitis when it is this thickened. Consider cholecystitis only if clinically suspected. Infiltrative disease is possible but rare. 3. Perihepatic ascites. 4. Prominent common bile duct. 5. Increased echogenicity of the renal cortex on both sides, without significant volume loss.  Findings consistent with medical renal disease. 6. Aortic atherosclerosis. 7. Obscured pancreas due to bowel gas. Electronically Signed   By: Telford Nab M.D.   On: 12/25/2021 04:15    Procedures Procedures    Medications Ordered in ED Medications  cefTRIAXone (ROCEPHIN) 2 g in sodium chloride 0.9 % 100 mL IVPB (0 g Intravenous Stopped 12/25/21 0315)    ED Course/ Medical Decision Making/ A&P Clinical Course as of 12/26/21 1748  Sun Dec 24, 2021  2330 WBC(!): 17.0 Patient with new leukocytosis, lactic acid added  [AH]  Mon Dec 25, 2021  0012 INR(!): 1.9 Inr worsening [AH]  0034 Potassium(!): 5.4 [AH]    Clinical Course User Index [AH] Margarita Mail, PA-C                           Medical Decision Making Amount and/or Complexity of Data Reviewed Labs: ordered. Decision-making details documented in ED Course. Radiology: ordered. ECG/medicine tests: ordered.  Risk Decision regarding hospitalization.   This patient presents to the ED for concern of fatigue, this involves an extensive number of treatment options, and is a complaint that carries with it a high risk of complications and  morbidity.  The differential diagnosis includes The differential diagnosis of weakness includes but is not limited to neurologic causes (GBS, myasthenia gravis, CVA, MS, ALS, transverse myelitis, spinal cord injury, CVA, botulism, ) and other causes: ACS, Arrhythmia, syncope, orthostatic hypotension, sepsis, hypoglycemia, electrolyte disturbance, hypothyroidism, respiratory failure, symptomatic anemia, dehydration, heat injury, polypharmacy, malignancy.    Co morbidities that complicate the patient evaluation  Metastatic cancer   Additional history obtained:  Additional history obtained from patients daughter and HCPOA    Lab Tests:  I Ordered, and personally interpreted labs.  The pertinent results include:  cbc w/ leukocytosis CMP with marked increase in creatinine and bilirubin Pt/inr elevated Ammonia wnl  Imaging Studies ordered:  I ordered imaging studies including ct abd/pelvis I independently visualized and interpreted imaging which showed large amount of ascites and known metastatic lesions  I agree with the radiologist interpretation   Cardiac Monitoring:  The patient was maintained on a cardiac monitor.  I personally viewed and interpreted the cardiac monitored which showed an underlying rhythm of: rate controlled afib   Medicines ordered and prescription drug management:   Consultations Obtained:  I requested consultation with the hospitalist, Dr. Tonie Griffith,  and discussed lab and imaging findings as well as pertinent plan - they recommend: admission, consultation with Hospice care as pts condision is sg worsenin   Problem List / ED Course:  acute liver failure, AKI, malignant ascites   Reevaluation:  After the interventions noted above, I reevaluated the patient and found that they have :stayed the same   Social Determinants of Health:  END of life, needs hospice   Dispostion:  After consideration of the diagnostic results and the patients  response to treatment, I feel that the patent would benefit from admission.  Final Clinical Impression(s) / ED Diagnoses Final diagnoses:  Acute liver failure without hepatic coma  Malignant ascites  AKI (acute kidney injury) ([place])  Acute liver failure    Rx / DC Orders ED Discharge Orders          Ordered    morphine 1 mg/mL SOLN infusion  Continuous        12/25/21 1304    ondansetron (ZOFRAN) 4 MG/2ML SOLN injection  Every 6 hours PRN  12/25/21 [place], PA-C 12/26/21 1749    Orpah Greek, MD 12/30/21 [phone]

## 2021-12-24 NOTE — ED Triage Notes (Signed)
Pt arrives to ED BIB [facility] due to fatigue. Per EMS pt recently diagnosed with Pancreatic Cancer and daughter has noticed that pt has been weak, not wanting to eat, SHOB on exertion. Abdomen noted to be distended and rigid, Denies any pain. Pt A/O x4.  BP 116/84 HR 72 O2 98% RA CBG 289

## 2021-12-25 ENCOUNTER — Encounter (HOSPITAL_COMMUNITY): Payer: Self-pay

## 2021-12-25 ENCOUNTER — Emergency Department (HOSPITAL_COMMUNITY)

## 2021-12-25 ENCOUNTER — Telehealth: Payer: Self-pay | Admitting: Oncology

## 2021-12-25 ENCOUNTER — Other Ambulatory Visit: Payer: Self-pay

## 2021-12-25 ENCOUNTER — Inpatient Hospital Stay (HOSPITAL_COMMUNITY)

## 2021-12-25 DIAGNOSIS — I480 Paroxysmal atrial fibrillation: Secondary | ICD-10-CM | POA: Diagnosis present

## 2021-12-25 DIAGNOSIS — Z515 Encounter for palliative care: Secondary | ICD-10-CM | POA: Diagnosis not present

## 2021-12-25 DIAGNOSIS — R5383 Other fatigue: Secondary | ICD-10-CM | POA: Diagnosis present

## 2021-12-25 DIAGNOSIS — I10 Essential (primary) hypertension: Secondary | ICD-10-CM | POA: Diagnosis present

## 2021-12-25 DIAGNOSIS — R180 Malignant ascites: Secondary | ICD-10-CM | POA: Diagnosis present

## 2021-12-25 DIAGNOSIS — Z20822 Contact with and (suspected) exposure to covid-19: Secondary | ICD-10-CM | POA: Diagnosis present

## 2021-12-25 DIAGNOSIS — G9341 Metabolic encephalopathy: Secondary | ICD-10-CM | POA: Diagnosis present

## 2021-12-25 DIAGNOSIS — C787 Secondary malignant neoplasm of liver and intrahepatic bile duct: Secondary | ICD-10-CM | POA: Diagnosis present

## 2021-12-25 DIAGNOSIS — Z79899 Other long term (current) drug therapy: Secondary | ICD-10-CM | POA: Diagnosis not present

## 2021-12-25 DIAGNOSIS — I445 Left posterior fascicular block: Secondary | ICD-10-CM | POA: Diagnosis present

## 2021-12-25 DIAGNOSIS — R7303 Prediabetes: Secondary | ICD-10-CM | POA: Diagnosis present

## 2021-12-25 DIAGNOSIS — N179 Acute kidney failure, unspecified: Secondary | ICD-10-CM

## 2021-12-25 DIAGNOSIS — Z801 Family history of malignant neoplasm of trachea, bronchus and lung: Secondary | ICD-10-CM | POA: Diagnosis not present

## 2021-12-25 DIAGNOSIS — C259 Malignant neoplasm of pancreas, unspecified: Principal | ICD-10-CM

## 2021-12-25 DIAGNOSIS — Z66 Do not resuscitate: Secondary | ICD-10-CM | POA: Diagnosis present

## 2021-12-25 DIAGNOSIS — K7200 Acute and subacute hepatic failure without coma: Secondary | ICD-10-CM | POA: Diagnosis present

## 2021-12-25 DIAGNOSIS — Z8249 Family history of ischemic heart disease and other diseases of the circulatory system: Secondary | ICD-10-CM | POA: Diagnosis not present

## 2021-12-25 LAB — COMPREHENSIVE METABOLIC PANEL
ALT: 141 U/L — ABNORMAL HIGH (ref 0–44)
ALT: 142 U/L — ABNORMAL HIGH (ref 0–44)
AST: 108 U/L — ABNORMAL HIGH (ref 15–41)
AST: 111 U/L — ABNORMAL HIGH (ref 15–41)
Albumin: 1.8 g/dL — ABNORMAL LOW (ref 3.5–5.0)
Albumin: 2 g/dL — ABNORMAL LOW (ref 3.5–5.0)
Alkaline Phosphatase: 362 U/L — ABNORMAL HIGH (ref 38–126)
Alkaline Phosphatase: 365 U/L — ABNORMAL HIGH (ref 38–126)
Anion gap: 10 (ref 5–15)
Anion gap: 11 (ref 5–15)
BUN: 65 mg/dL — ABNORMAL HIGH (ref 8–23)
BUN: 70 mg/dL — ABNORMAL HIGH (ref 8–23)
CO2: 23 mmol/L (ref 22–32)
CO2: 23 mmol/L (ref 22–32)
Calcium: 8.6 mg/dL — ABNORMAL LOW (ref 8.9–10.3)
Calcium: 8.9 mg/dL (ref 8.9–10.3)
Chloride: 101 mmol/L (ref 98–111)
Chloride: 105 mmol/L (ref 98–111)
Creatinine, Ser: 1.86 mg/dL — ABNORMAL HIGH (ref 0.61–1.24)
Creatinine, Ser: 1.95 mg/dL — ABNORMAL HIGH (ref 0.61–1.24)
GFR, Estimated: 36 mL/min — ABNORMAL LOW (ref >60)
GFR, Estimated: 38 mL/min — ABNORMAL LOW (ref >60)
Glucose, Bld: 177 mg/dL — ABNORMAL HIGH (ref 70–99)
Glucose, Bld: 254 mg/dL — ABNORMAL HIGH (ref 70–99)
Potassium: 5.2 mmol/L — ABNORMAL HIGH (ref 3.5–5.1)
Potassium: 5.4 mmol/L — ABNORMAL HIGH (ref 3.5–5.1)
Sodium: 135 mmol/L (ref 135–145)
Sodium: 138 mmol/L (ref 135–145)
Total Bilirubin: 39.3 mg/dL (ref 0.3–1.2)
Total Bilirubin: 42.4 mg/dL (ref 0.3–1.2)
Total Protein: 5.3 g/dL — ABNORMAL LOW (ref 6.5–8.1)
Total Protein: 6.1 g/dL — ABNORMAL LOW (ref 6.5–8.1)

## 2021-12-25 LAB — CBC WITH DIFFERENTIAL/PLATELET
Abs Immature Granulocytes: 0.24 10*3/uL — ABNORMAL HIGH (ref 0.00–0.07)
Basophils Absolute: 0.1 10*3/uL (ref 0.0–0.1)
Basophils Relative: 0 %
Eosinophils Absolute: 0 10*3/uL (ref 0.0–0.5)
Eosinophils Relative: 0 %
HCT: 41.9 % (ref 39.0–52.0)
Hemoglobin: 15.6 g/dL (ref 13.0–17.0)
Immature Granulocytes: 1 %
Lymphocytes Relative: 3 %
Lymphs Abs: 0.6 10*3/uL — ABNORMAL LOW (ref 0.7–4.0)
MCH: 29 pg (ref 26.0–34.0)
MCHC: 37.2 g/dL — ABNORMAL HIGH (ref 30.0–36.0)
MCV: 77.9 fL — ABNORMAL LOW (ref 80.0–100.0)
Monocytes Absolute: 1 10*3/uL (ref 0.1–1.0)
Monocytes Relative: 6 %
Neutro Abs: 15.1 10*3/uL — ABNORMAL HIGH (ref 1.7–7.7)
Neutrophils Relative %: 90 %
Platelets: 97 10*3/uL — ABNORMAL LOW (ref 150–400)
RBC: 5.38 MIL/uL (ref 4.22–5.81)
RDW: 22.1 % — ABNORMAL HIGH (ref 11.5–15.5)
Smear Review: DECREASED
WBC: 17 10*3/uL — ABNORMAL HIGH (ref 4.0–10.5)
nRBC: 0.9 % — ABNORMAL HIGH (ref 0.0–0.2)

## 2021-12-25 LAB — CBC
HCT: 37.8 % — ABNORMAL LOW (ref 39.0–52.0)
Hemoglobin: 13.7 g/dL (ref 13.0–17.0)
MCH: 28.8 pg (ref 26.0–34.0)
MCHC: 36.2 g/dL — ABNORMAL HIGH (ref 30.0–36.0)
MCV: 79.6 fL — ABNORMAL LOW (ref 80.0–100.0)
Platelets: 79 10*3/uL — ABNORMAL LOW (ref 150–400)
RBC: 4.75 MIL/uL (ref 4.22–5.81)
RDW: 22 % — ABNORMAL HIGH (ref 11.5–15.5)
WBC: 17.6 10*3/uL — ABNORMAL HIGH (ref 4.0–10.5)
nRBC: 0.8 % — ABNORMAL HIGH (ref 0.0–0.2)

## 2021-12-25 LAB — RESP PANEL BY RT-PCR (FLU A&B, COVID) ARPGX2
Influenza A by PCR: NEGATIVE
Influenza B by PCR: NEGATIVE
SARS Coronavirus 2 by RT PCR: NEGATIVE

## 2021-12-25 LAB — AMMONIA: Ammonia: 13 umol/L (ref 9–35)

## 2021-12-25 LAB — PROTIME-INR
INR: 1.9 — ABNORMAL HIGH (ref 0.8–1.2)
Prothrombin Time: 21.7 seconds — ABNORMAL HIGH (ref 11.4–15.2)

## 2021-12-25 LAB — LACTIC ACID, PLASMA: Lactic Acid, Venous: 2.1 mmol/L (ref 0.5–1.9)

## 2021-12-25 LAB — LIPASE, BLOOD: Lipase: 54 U/L — ABNORMAL HIGH (ref 11–51)

## 2021-12-25 MED ORDER — ONDANSETRON 4 MG PO TBDP: 4.0000 mg | ORAL_TABLET | Freq: Four times a day (QID) | ORAL | Status: DC | PRN

## 2021-12-25 MED ORDER — MORPHINE 100MG IN NS 100ML (1MG/ML) PREMIX INFUSION: 1.0000 mg/h | INTRAVENOUS | 0 refills | Status: AC

## 2021-12-25 MED ORDER — ONDANSETRON HCL 4 MG/2ML IJ SOLN: 4.0000 mg | Freq: Four times a day (QID) | INTRAMUSCULAR | 0 refills | Status: AC | PRN

## 2021-12-25 MED ORDER — ONDANSETRON HCL 4 MG/2ML IJ SOLN: 4.0000 mg | Freq: Four times a day (QID) | INTRAMUSCULAR | Status: DC | PRN

## 2021-12-25 MED ORDER — SENNOSIDES-DOCUSATE SODIUM 8.6-50 MG PO TABS: 1.0000 | ORAL_TABLET | Freq: Every evening | ORAL | Status: DC | PRN

## 2021-12-25 MED ORDER — MORPHINE SULFATE (CONCENTRATE) 10 MG/0.5ML PO SOLN: 5.0000 mg | ORAL | Status: DC | PRN

## 2021-12-25 MED ORDER — SODIUM CHLORIDE 0.9 % IV SOLN
2.0000 g | Freq: Once | INTRAVENOUS | Status: AC
  Administered 2021-12-25: 2 g via INTRAVENOUS
  Filled 2021-12-25: qty 20

## 2021-12-25 MED ORDER — MORPHINE BOLUS VIA INFUSION
2.0000 mg | INTRAVENOUS | Status: DC | PRN
  Filled 2021-12-25: qty 2

## 2021-12-25 MED ORDER — SODIUM CHLORIDE 0.9 % IV SOLN: 250.0000 mL | INTRAVENOUS | Status: DC | PRN

## 2021-12-25 MED ORDER — ONDANSETRON HCL 4 MG PO TABS: 4.0000 mg | ORAL_TABLET | Freq: Four times a day (QID) | ORAL | Status: DC | PRN

## 2021-12-25 MED ORDER — SODIUM CHLORIDE 0.9 % IV SOLN: 2.0000 g | INTRAVENOUS | Status: DC

## 2021-12-25 MED ORDER — MORPHINE 100MG IN NS 100ML (1MG/ML) PREMIX INFUSION: 1.0000 mg/h | INTRAVENOUS | Status: DC

## 2021-12-25 MED ORDER — SODIUM CHLORIDE 0.9% FLUSH: 3.0000 mL | Freq: Two times a day (BID) | INTRAVENOUS | Status: DC

## 2021-12-25 MED ORDER — LACTATED RINGERS IV SOLN: INTRAVENOUS | Status: DC

## 2021-12-25 MED ORDER — HYDROMORPHONE HCL 1 MG/ML IJ SOLN
1.0000 mg | INTRAMUSCULAR | Status: DC | PRN
  Administered 2021-12-25: 1 mg via INTRAVENOUS
  Filled 2021-12-25: qty 1

## 2021-12-25 MED ORDER — SODIUM CHLORIDE 0.9% FLUSH: 3.0000 mL | INTRAVENOUS | Status: DC | PRN

## 2021-12-25 MED ORDER — CARVEDILOL 3.125 MG PO TABS: 25.0000 mg | ORAL_TABLET | Freq: Two times a day (BID) | ORAL | Status: DC

## 2021-12-25 MED ORDER — ONDANSETRON HCL 4 MG/2ML IJ SOLN
4.0000 mg | Freq: Four times a day (QID) | INTRAMUSCULAR | Status: DC | PRN
  Administered 2021-12-25: 4 mg via INTRAVENOUS
  Filled 2021-12-25: qty 2

## 2021-12-25 NOTE — Assessment & Plan Note (Addendum)
Recently diagnosised with liver metastasis. Has seen oncology but no chemotherapy or radiation therapy being undertaken Prognosis is poor

## 2021-12-25 NOTE — Telephone Encounter (Signed)
Patient's significant other called to report patient is currently at [facility] in [place] and will be going into residential hospice.   Routing to clinical team to determine next steps.

## 2021-12-25 NOTE — Discharge Planning (Signed)
Pt currently active with [facility] for [facility] services. .  Pt disposition will likely be Residential Hospice following this admission.  RNCM place call and left message for [facility] liaison, Farrel Gordon, RN regarding referral to residential hospice.  RNCM will continue to follow.  Altus Zaino J. Clydene Laming, RN, BSN, NCM  Transitions of Care   Nurse Case Manager  [facility] Emergency Departments   Operative Services  [phone]

## 2021-12-25 NOTE — Assessment & Plan Note (Signed)
Pt has not been started on anticoagulation. Now has liver failure with elevated INR so will hold off starting anticoagulation due to increased risk of bleeding

## 2021-12-25 NOTE — Assessment & Plan Note (Signed)
IVF hydration with LR at 75 ml/hr.  Recheck electrolytes and renal function in am

## 2021-12-25 NOTE — Discharge Planning (Signed)
[facility] for transfer to residential hospice after 4:00 today; still awaiting signed consent from family.

## 2021-12-25 NOTE — H&P (Signed)
History and Physical    PatientMarland Kitchen Odas Ozer Pope:[id number] DOB: 26-Apr-1951 DOA: 12/24/2021 DOS: the patient was seen and examined on 12/25/2021 PCP: Kirk Ruths, MD  Patient coming from: Home  Chief Complaint:  Chief Complaint  Patient presents with   Fatigue    HPI: Ivan Pope is a 71 y.o. 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 He was diagnosised two weeks ago with pancreatic cancer and saw oncology but will not be treated with radiation or chemotherapy per daughter.  Since his diagnosis he has had decrease in his state with increasing weakness, poor appetite and increasing jaundice.  Daughter reports that she was told by oncology that he is approximately 2 to 3 weeks to live.  Home hospice has been coming out of the house but daughter states when they come Mr. Suman puts on a good front per daughter and will smile and talk with hospice nurse and tell them he is feeling fine. Daughter states that hospice nurse has told her he does not need increase in level of hospice care in his condition. But once the hospice nurse leave he does not get up and has very little appetite.  Daughter reports he takes multiple family members to get him to even stand up off the couch.  He has been sitting in his own urine and feces while on the couch and family is struggling to care for him.  Daughter reports he has no energy and seems to have lost his will to go along.  He will sit and just stare and not verbally respond to family members.  Reports over the last few days his abdomen has become a little more distended than it was previously.  Daughter reports that when he was admitted to [facility] at the beginning of this month she was told he was going to have a paracentesis performed but it was not done when he was discharged and there is no mention of it after he went home.  Daughter states  he has not had any fever, nausea vomiting or diarrhea.  He does not complain of any chest pain shortness of breath or cough.  Review of Systems: As mentioned in the history of present illness. All other systems reviewed and are negative. Past Medical History:  Diagnosis Date   Adenomatous polyp 2014   Elevated bilirubin    Erectile dysfunction    Fatigue    Gallstones    Gout    Hypertension    Liver cancer ([place]) 2023   suspected   Prediabetes    Snoring    Past Surgical History:  Procedure Laterality Date   COLONOSCOPY  2014   Dr. Mamie Nick. [place] @ [place], rpt 5 yrs per [facility]   MOUTH SURGERY     Social History:  reports that he has never smoked. He has never been exposed to tobacco smoke. He has never used smokeless tobacco. He reports that he does not currently use alcohol. No history on file for drug use.  No Known Allergies  Family History  Problem Relation Age of Onset   Lung cancer Mother    Hypertension Daughter     Prior to Admission medications   Medication Sig Start Date End Date Taking? Authorizing Provider  allopurinol (ZYLOPRIM) 100 MG tablet Take 100 mg by mouth daily. 04/04/21  Yes [provider]  allopurinol (ZYLOPRIM) 300 MG tablet Take 300 mg by  mouth daily. 10/10/21  Yes [provider]  carvedilol (COREG) 25 MG tablet Take 1 tablet (25 mg total) by mouth 2 (two) times daily. 12/14/21 03/14/22 Yes Enzo Bi, MD  dexamethasone (DECADRON) 4 MG tablet Take 1 tablet (4 mg total) by mouth daily. 12/15/21  Yes Borders, Kirt Boys, NP  feeding supplement (ENSURE ENLIVE / ENSURE PLUS) LIQD Take 237 mLs by mouth 3 (three) times daily between meals. 12/13/21  Yes Enzo Bi, MD  Morphine Sulfate (MORPHINE CONCENTRATE) 10 mg / 0.5 ml concentrated solution Take 0.25-0.5 mLs (5-10 mg total) by mouth every 2 (two) hours as needed for severe pain. 12/15/21  Yes Borders, Kirt Boys, NP  ondansetron (ZOFRAN) 8 MG tablet Take 1 tablet (8 mg total) by mouth every 8  (eight) hours as needed for nausea or vomiting. 12/15/21  Yes Borders, Kirt Boys, NP  lisinopril (ZESTRIL) 40 MG tablet Hold until PCP followup since your blood pressure has been low normal. Patient not taking: Reported on 12/25/2021 12/13/21   Enzo Bi, MD  spironolactone (ALDACTONE) 25 MG tablet Hold until PCP followup since your blood pressure has been low normal. Patient not taking: Reported on 12/25/2021 12/13/21   Enzo Bi, MD  tadalafil (CIALIS) 5 MG tablet Take 1 tablet (5 mg total) by mouth daily as needed for erectile dysfunction. Home med. Patient not taking: Reported on 12/25/2021 12/13/21   Enzo Bi, MD    Physical Exam: Vitals:   12/24/21 2300 12/24/21 2316 12/25/21 0045 12/25/21 0145  BP: (!) 109/91   95/70  Pulse: 83  77   Resp: (!) 21  17 14   Temp:  (!) 97.5 F (36.4 C)    TempSrc:  Axillary    SpO2: 97%  93%    General: WDWN, Alert and oriented x3.  Eyes: EOMI, PERRL, conjunctivae normal.  Sclera icteric HENT:  [place]/AT, external ears normal.  Nares patent without epistasis.  Mucous membranes are dry. Posterior pharynx clear of any exudate  Neck: Soft, normal range of motion, supple, no masses, Trachea midline Respiratory: clear to auscultation bilaterally, no wheezing, no crackles. Normal respiratory effort. No accessory muscle use.  Cardiovascular: Regular rate and rhythm, no murmurs / rubs / gallops. No extremity edema. 2+ pedal pulses. Abdomen: Soft, no tenderness, distended, no rebound or guarding.  No masses palpated. Bowel sounds hypoactive Musculoskeletal: no cyanosis. No joint deformity upper and lower extremities. Normal muscle tone.  Skin: Warm, dry, intact no rashes, lesions, ulcers. No induration Neurologic: CN 2-12 grossly intact. Normal speech.  Sensation intact, patella Psychiatric: Depressed mood with flat affect   Data Reviewed: Lab work revealed WBC 17,000 hemoglobin 15.6 hematocrit 41.9 platelets 97,000 lactic acid 2.1 135 potassium 5.4 chloride 101 bicarb  23 creatinine 1.86 BUN 65 glucose 254 calcium 8.9 albumin 2 alkaline phosphatase 362 AST 108 ALT 141 bilirubin 42.4 lipase 54 INR 1.9 COVID-negative.  Influenza A and B negative  EKG shows atrial fibrillation with no acute ST elevation or depression.  Left posterior secular block noted.  QTc 413  CT of the abdomen and pelvis without contrast shows trace perihepatic and pelvic ascites with innumerable mets metastases to the liver with nodules in base of lungs bilaterally.  Patient with cholelithiasis  Assessment and Plan: Acute liver failure- (present on admission) Mr. Sperling is admitted to med-surg floor.  Has increase in bilirubin and LFTs. Has elevated INR suggesting liver dysfunction in light of multiple liver metastasis.  Abdominal u/s to further evaluate ascites and liver to determine if  paracentesis needed Consult GI in am In speaking with daughter, who is POA, she states that he has had home hospice for past few weeks but has declined in past week and not able to get up and famiily struggling to care for him. Will need more care than family can provide and may be time to transition to Hospice house. Has elevated WBC and started on Rocephin empirically in the ER  Pancreatic adenocarcinoma [facility])- (present on admission) Recently diagnosised with liver metastasis. Has seen oncology but no chemotherapy or radiation therapy being undertaken Prognosis is poor  AKI (acute kidney injury) ([place]) IVF hydration with LR at 75 ml/hr.  Recheck electrolytes and renal function in am  PAF (paroxysmal atrial fibrillation) (HCC) Pt has not been started on anticoagulation. Now has liver failure with elevated INR so will hold off starting anticoagulation due to increased risk of bleeding  Essential hypertension- (present on admission) Continue home medication of coreg Monitor BP  Hyperbilirubinemia- (present on admission) Bilirubin has increased in past week with worsening of jaundice. Await GI  recommendations   Advance Care Planning:   Code Status: DNR   Consults: Gastroenterology for evaluation in the morning.  Secure chat sent to the [place] [facility]  Family Communication: Diagnosis and plan discussed with patient and his daughter who is at bedside.  Verbalized understanding.  Agree with plan.  Further recommendations to follow as clinically indicated  Author: Eben Burow, MD 12/25/2021 2:58 AM  For on call review [email or web address].

## 2021-12-25 NOTE — Assessment & Plan Note (Addendum)
Ivan Pope is admitted to med-surg floor.  Has increase in bilirubin and LFTs. Has elevated INR suggesting liver dysfunction in light of multiple liver metastasis.  Consult GI in am In speaking with daughter, who is POA, she states that he has had home hospice for past few weeks but has declined in past week and not able to get up and famiily struggling to care for him. Will need more care than family can provide and may be time to transition to Hospice house. Has elevated WBC and started on Rocephin empirically in the ER

## 2021-12-25 NOTE — ED Notes (Signed)
[facility] called for transport.  

## 2021-12-25 NOTE — Consult Note (Addendum)
Attending physician's note   I have taken an interval history, reviewed the chart and examined the patient. >50% of the total time spent with this patient. I agree with the Advanced Practitioner's note, impression, and recommendations as outlined.   71 year old male with recently diagnosed metastatic Pancreatic Cancer presents with fatigue, AMS, weakness, decreased appetite, and noted to have increasing liver enzymes and uptrending bilirubin.  Recent outpatient labs, imaging, and Oncology outpatient notes reviewed.  Currently receives Home Hospice.  CT demonstrates innumerable hepatic metastasis similar to recent MRI 2 weeks ago.  Cholelithiasis but no e/o choledocholithiasis.  1) Pancreatic Adenocarcinoma with metastasis 2) Elevated bilirubin 3) AKI  Unfortunately, uptrending bilirubin 2/2 progressive diffuse hepatic metastasis.  No primary obstructing lesion that would warrant endoscopic intervention such as ERCP.  I discussed his care with the primary Hospitalist service and agree with pursuing [facility].  GI service will sign off at this time.  Please do not hesitate to contact us with additional questions or concerns.  [address], [place], [place] [phone] office          Consultation  Referring Provider: Dr. Pietro Cassis     Primary Care Physician:  Kirk Ruths, MD Primary Gastroenterologist:   Althia Forts      Reason for Consultation: Acute liver failure in the setting of pancreatic cancer with liver mets             HPI:   Ivan Pope is a 71 y.o. male with a past medical history significant for PAF not on anticoagulation, hypertension, pancreatic carcinoma with mets to the liver diagnosed 2 weeks ago, who presented to the hospital with fatigue, increased weakness, poor appetite and increasing jaundice.    Per daughter at time of arrival reported that oncology said he had approximately 2 to 3 weeks to live, home hospice has been coming out to the house  but daughter states when they come Ivan Pope puts on a good front and will smile and talk until then he is fine.  In general he has not been getting up and has very little appetite.  Apparently has been sitting in his own urine and feces on the couch and family struggling to care for him.  They do describe some increased abdominal distention.    Today, is only oriented to self.  Much of the history is garnered from previous physicians notes as above apparently garnered from his daughter at time of arrival.  He has been declining per their notes and unable to really do anything including get up from the couch or chair.      Patient denies any pain to me at time of exam.  ER course: WBC 17,000, hemoglobin 15.6, platelets 97,000, potassium 5.4, creatinine 1.86, alk phos 362, AST 108, ALT 141, bilirubin 42.4, lipase 54, INR 1.9, EKG with A-fib, CT of the abdomen pelvis without contrast showed trace perihepatic and pelvic ascites with innumerable mets/metastasis to the liver with nodules in base of lungs bilaterally and cholelithiasis  Past Medical History:  Diagnosis Date   Adenomatous polyp 2014   Elevated bilirubin    Erectile dysfunction    Fatigue    Gallstones    Gout    Hypertension    Liver cancer ([place]) 2023   suspected   Prediabetes    Snoring     Past Surgical History:  Procedure Laterality Date   COLONOSCOPY  2014   Dr. Mamie Nick. [place] @ [facility] - Adenomatous Polyp, rpt 5 yrs per  [facility]   MOUTH SURGERY      Family History  Problem Relation Age of Onset   Lung cancer Mother    Hypertension Daughter     Social History   Tobacco Use   Smoking status: Never    Passive exposure: Never   Smokeless tobacco: Never  Vaping Use   Vaping Use: Never used  Substance Use Topics   Alcohol use: Not Currently    Prior to Admission medications   Medication Sig Start Date End Date Taking? Authorizing Provider  allopurinol (ZYLOPRIM) 100 MG tablet Take 100 mg by mouth daily. 04/04/21  Yes  [provider]  allopurinol (ZYLOPRIM) 300 MG tablet Take 300 mg by mouth daily. 10/10/21  Yes [provider]  carvedilol (COREG) 25 MG tablet Take 1 tablet (25 mg total) by mouth 2 (two) times daily. 12/14/21 03/14/22 Yes Enzo Bi, MD  dexamethasone (DECADRON) 4 MG tablet Take 1 tablet (4 mg total) by mouth daily. 12/15/21  Yes Borders, Kirt Boys, NP  feeding supplement (ENSURE ENLIVE / ENSURE PLUS) LIQD Take 237 mLs by mouth 3 (three) times daily between meals. 12/13/21  Yes Enzo Bi, MD  Morphine Sulfate (MORPHINE CONCENTRATE) 10 mg / 0.5 ml concentrated solution Take 0.25-0.5 mLs (5-10 mg total) by mouth every 2 (two) hours as needed for severe pain. 12/15/21  Yes Borders, Kirt Boys, NP  ondansetron (ZOFRAN) 8 MG tablet Take 1 tablet (8 mg total) by mouth every 8 (eight) hours as needed for nausea or vomiting. 12/15/21  Yes Borders, Kirt Boys, NP  lisinopril (ZESTRIL) 40 MG tablet Hold until PCP followup since your blood pressure has been low normal. Patient not taking: Reported on 12/25/2021 12/13/21   Enzo Bi, MD  spironolactone (ALDACTONE) 25 MG tablet Hold until PCP followup since your blood pressure has been low normal. Patient not taking: Reported on 12/25/2021 12/13/21   Enzo Bi, MD  tadalafil (CIALIS) 5 MG tablet Take 1 tablet (5 mg total) by mouth daily as needed for erectile dysfunction. Home med. Patient not taking: Reported on 12/25/2021 12/13/21   Enzo Bi, MD    Current Facility-Administered Medications  Medication Dose Route Frequency Provider Last Rate Last Admin   0.9 %  sodium chloride infusion  250 mL Intravenous PRN Dahal, Marlowe Aschoff, MD       HYDROmorphone (DILAUDID) injection 1 mg  1 mg Intravenous Q4H PRN Chotiner, Yevonne Aline, MD   1 mg at 12/25/21 0330   morphine 118m in NS 1022m(21m7mL) infusion - premix  1 mg/hr Intravenous Continuous Dahal, Binaya, MD       morphine bolus via infusion 2 mg  2 mg Intravenous Q15 min PRN Dahal, Binaya, MD       morphine CONCENTRATE  10 MG/0.5ML oral solution 5 mg  5 mg Oral Q2H PRN Dahal, Binaya, MD       Or   morphine CONCENTRATE 10 MG/0.5ML oral solution 5 mg  5 mg Sublingual Q2H PRN Dahal, Binaya, MD       ondansetron (ZOFRAN) tablet 4 mg  4 mg Oral Q6H PRN Chotiner, BraYevonne AlineD       Or   ondansetron (ZOSurgicare Of St Andrews Ltdnjection 4 mg  4 mg Intravenous Q6H PRN Chotiner, BraYevonne AlineD   4 mg at 12/25/21 0330   ondansetron (ZOFRAN-ODT) disintegrating tablet 4 mg  4 mg Oral Q6H PRN Dahal, BinMarlowe AschoffD       Or   ondansetron (ZOFRAN) injection 4 mg  4 mg Intravenous Q6H  PRN Terrilee Croak, MD       sodium chloride flush (NS) 0.9 % injection 3 mL  3 mL Intravenous Q12H Dahal, Binaya, MD       sodium chloride flush (NS) 0.9 % injection 3 mL  3 mL Intravenous PRN Dahal, Marlowe Aschoff, MD       Current Outpatient Medications  Medication Sig Dispense Refill   allopurinol (ZYLOPRIM) 100 MG tablet Take 100 mg by mouth daily.     allopurinol (ZYLOPRIM) 300 MG tablet Take 300 mg by mouth daily.     carvedilol (COREG) 25 MG tablet Take 1 tablet (25 mg total) by mouth 2 (two) times daily. 60 tablet 2   dexamethasone (DECADRON) 4 MG tablet Take 1 tablet (4 mg total) by mouth daily. 15 tablet 0   feeding supplement (ENSURE ENLIVE / ENSURE PLUS) LIQD Take 237 mLs by mouth 3 (three) times daily between meals.     Morphine Sulfate (MORPHINE CONCENTRATE) 10 mg / 0.5 ml concentrated solution Take 0.25-0.5 mLs (5-10 mg total) by mouth every 2 (two) hours as needed for severe pain. 30 mL 0   ondansetron (ZOFRAN) 8 MG tablet Take 1 tablet (8 mg total) by mouth every 8 (eight) hours as needed for nausea or vomiting. 20 tablet 0   lisinopril (ZESTRIL) 40 MG tablet Hold until PCP followup since your blood pressure has been low normal. (Patient not taking: Reported on 12/25/2021)     spironolactone (ALDACTONE) 25 MG tablet Hold until PCP followup since your blood pressure has been low normal. (Patient not taking: Reported on 12/25/2021)     tadalafil (CIALIS) 5 MG  tablet Take 1 tablet (5 mg total) by mouth daily as needed for erectile dysfunction. Home med. (Patient not taking: Reported on 12/25/2021) 10 tablet 0    Allergies as of 12/24/2021   (No Known Allergies)     Review of Systems:    Unable to complete due to mental status   Physical Exam:  Vital signs in last 24 hours: Temp:  [97.5 F (36.4 C)] 97.5 F (36.4 C) (02/12 2316) Pulse Rate:  [56-87] 80 (02/13 0800) Resp:  [14-29] 19 (02/13 0800) BP: (71-131)/(57-98) 90/68 (02/13 0800) SpO2:  [90 %-97 %] 93 % (02/13 0800)   General:  AA male appears to be in NAD, Well developed, Well nourished, alert  Head:  Normocephalic and atraumatic. Eyes:   PEERL, EOMI. No icterus. Conjunctiva pink. Ears:  Normal auditory acuity. Neck:  Supple Throat: Oral cavity and pharynx without inflammation, swelling or lesion.  Lungs: Respirations even and unlabored. Lungs clear to auscultation bilaterally.   No wheezes, crackles, or rhonchi.  Heart: Normal S1, S2. No MRG. Regular rate and rhythm. No peripheral edema, cyanosis or pallor.  Abdomen:  Soft, moderate distension, nontender. No rebound or guarding. Normal bowel sounds. No appreciable masses or hepatomegaly. Rectal:  Not performed.  Msk:  Symmetrical without gross deformities. Peripheral pulses intact.  Extremities:  Without edema, no deformity or joint abnormality.  Neurologic:  Alert and  oriented x1;  +asterixis Skin:   Dry and intact without significant lesions or rashes. Psychiatric: Oriented to person only   LAB RESULTS: Recent Labs    12/24/21 2348  WBC 17.0*  HGB 15.6  HCT 41.9  PLT 97*   BMET Recent Labs    12/24/21 2348  NA 135  K 5.4*  CL 101  CO2 23  GLUCOSE 254*  BUN 65*  CREATININE 1.86*  CALCIUM 8.9   Hepatic Function Latest  Ref Rng & Units 12/24/2021 12/14/2021 12/13/2021  Total Protein 6.5 - 8.1 g/dL 6.1(L) 6.5 6.3(L)  Albumin 3.5 - 5.0 g/dL 2.0(L) 2.3(L) 2.4(L)  AST 15 - 41 U/L 108(H) 36 36  ALT 0 - 44 U/L  141(H) 29 30  Alk Phosphatase 38 - 126 U/L 362(H) 202(H) 196(H)  Total Bilirubin 0.3 - 1.2 mg/dL 42.4(HH) 22.9(HH) 21.1(HH)  Bilirubin, Direct 0.0 - 0.2 mg/dL - - -     PT/INR Recent Labs    12/24/21 2348  LABPROT 21.7*  INR 1.9*    STUDIES: CT ABDOMEN PELVIS WO CONTRAST  Result Date: 12/25/2021 CLINICAL DATA:  Ascites. diagnosed with Pancreatic Cancer and daughter has noticed that pt has been weak, not wanting to eat, SHOB on exertion. Abdomen noted to be distended and rigid, Denies any pain. Pt A/O x4. EXAM: CT ABDOMEN AND PELVIS WITHOUT CONTRAST TECHNIQUE: Multidetector CT imaging of the abdomen and pelvis was performed following the standard protocol without IV contrast. RADIATION DOSE REDUCTION: This exam was performed according to the departmental dose-optimization program which includes automated exposure control, adjustment of the mA and/or kV according to patient size and/or use of iterative reconstruction technique. COMPARISON:  MRI abdomen 12/11/2021, CT angiography chest 12/05/2021 FINDINGS: Lower chest: Redemonstration of right lower lobe 1.3 x 1.2 cm ground-glass airspace opacity. Linear atelectasis versus scarring. Question left base pulmonary nodules measuring 0.4 cm and 1.2 cm Hepatobiliary: Innumerable hypodense hepatic lesions that are better evaluated on MRI abdomen 12/11/2021. Cholelithiasis. Likely contracted gallbladder with associated motion artifact. Markedly limited evaluation for gallbladder wall thickening or pericholecystic fluid. No biliary dilatation. Pancreas: Vague hypodensity in fullness along the pancreatic tail consistent with known pancreatic mass that is better evaluated on MR abdomen 12/11/2021 (3:27). The lesion is noted to abut the splenic hilum. Otherwise normal pancreatic contour. No surrounding inflammatory changes. No main pancreatic ductal dilatation. Spleen: Normal in size without focal abnormality. Adrenals/Urinary Tract: Vague pericentimeter right  adrenal gland nodule consistent with reported adenoma on MRI. No left adrenal nodularity. No nephrolithiasis and no hydronephrosis. No definite contour-deforming renal mass. No ureterolithiasis or hydroureter. The urinary bladder is unremarkable. Stomach/Bowel: Stomach is within normal limits. No evidence of bowel wall thickening or dilatation. Scattered colonic diverticulosis. Appendix appears normal. Vascular/Lymphatic: No abdominal aorta aneurysm. The right common iliac artery is enlarged in caliber measuring up to 1.6 cm. The left common iliac artery measures at the upper limits of normal: 1.5 cm. Severe calcified and noncalcified atherosclerotic plaque of the aorta and its branches. No abdominal, pelvic, or inguinal lymphadenopathy. Reproductive: Prostate is enlarged in caliber measuring up to 5.6 cm. Other: Trace perihepatic simple free fluid. Trace free fluid within the pelvis. No intraperitoneal free gas. No organized fluid collection. Musculoskeletal: No abdominal wall hernia or abnormality. No suspicious lytic or blastic osseous lesions. No acute displaced fracture. Multilevel degenerative changes of the spine. IMPRESSION: 1. Trace perihepatic and pelvic ascites. 2. Innumerable hepatic metastases that are better evaluated on MRI 12/11/2021 in a patient with known pancreatic cancer. 3. Question left base pulmonary nodules measuring 0.4 cm and 1.2 cm. Redemonstration of right lower lobe 1.3 x 1.2 cm ground-glass airspace opacity. 4. Cholelithiasis. Otherwise markedly limited evaluation of the gallbladder. If clinically indicated, consider right upper quadrant ultrasound for a more sensitive evaluation of the gallbladder. 5. Scattered colonic diverticulosis with no acute diverticulitis. 6. Prostatomegaly. 7. 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 8.  Aortic Atherosclerosis (ICD10-I70.0). Electronically Signed  By: Iven Finn M.D.   On: 12/25/2021  01:56   US Abdomen Complete  Result Date: 12/25/2021 CLINICAL DATA:  Acute liver failure, with known pancreatic cancer and numerous hepatic metastases. EXAM: ABDOMEN ULTRASOUND COMPLETE COMPARISON:  CT without contrast earlier today. FINDINGS: Gallbladder: The gallbladder partially contracted. There are multiple layering stones, largest is 1.4 cm. There is circumferential wall thickening to 9.4 mm. Negative sonographic Murphy sign and no pericholecystic fluid. Common bile duct: Diameter: 6.9 mm. Liver: Again noted is hepatic enlargement with numerous hypodense masses consistent with metastases, largest circumscribable lesion is 3.7 cm in the right lobe. Portal vein is patent on color Doppler imaging with normal direction of blood flow towards the liver. IVC: No abnormality visualized. Pancreas: Obscured by bowel gas. Spleen: Size and appearance within normal limits. Right Kidney: Length: 10.0 cm. Echogenicity is increased. No mass or hydronephrosis visualized. Left Kidney: Length: 10.5 cm. Echogenicity is increased . No mass or hydronephrosis visualized. Abdominal aorta: No aneurysm visualized. Moderate echogenic calcific plaque. Other findings: Small-volume perihepatic ascites. IMPRESSION: 1. Numerous hepatic metastases with hepatic enlargement. 2. Marked circumferential gallbladder thickening, with layering stones. The wall thickening is probably due to hepatic dysfunction or congestive, usually is not due to cholecystitis when it is this thickened. Consider cholecystitis only if clinically suspected. Infiltrative disease is possible but rare. 3. Perihepatic ascites. 4. Prominent common bile duct. 5. Increased echogenicity of the renal cortex on both sides, without significant volume loss. Findings consistent with medical renal disease. 6. Aortic atherosclerosis. 7. Obscured pancreas due to bowel gas. Electronically Signed   By: Telford Nab M.D.   On: 12/25/2021 04:15      Impression / Plan:    Impression: 1.  Acute liver failure: Due to below, MELD+Na 34, platelets decreasing, LFTs and bilirubin increasing, patient with encephalopathy and asterixis, currently on home hospice and declining 2.  Pancreatic adenocarcinoma with liver metastasis 3.  AKI 4.  PAF   Plan: 1.  At this point unfortunately there is not much for [place] to offer.  Would recommend consult for inpatient hospice.  Thank you for your kind consultation, we will sign off.  Lavone Nian [facility]  12/25/2021, 9:07 AM

## 2021-12-25 NOTE — Assessment & Plan Note (Signed)
Bilirubin has increased in past week with worsening of jaundice. Await GI recommendations

## 2021-12-25 NOTE — Progress Notes (Signed)
Manufacturing engineer [facility]) Hospital Liaison note.    Patient has been offered a bed at [facility] and can transfer today once consents are complete.  Liaison will notify TOC once consents are complete to set up transport.   RN please call report to [phone].  Thank you,    Farrel Gordon, RN, [facility] Liaison  [phone]

## 2021-12-25 NOTE — ED Notes (Signed)
Pt urinated on self and bed. This RN and Tech cleaned pt and changed into a clean gown.

## 2021-12-25 NOTE — Assessment & Plan Note (Addendum)
Continue home medication of coreg Monitor BP

## 2021-12-25 NOTE — Discharge Summary (Signed)
Physician Discharge Summary  Ivan Pope TKW:[id number] DOB: [birth year] DOA: 12/24/2021  PCP: Kirk Ruths, MD  Admit date: 12/24/2021 Discharge date: 12/25/2021  Admitted From: Home Discharge disposition: Residential hospice   Brief narrative: Ivan Pope is a 71 y.o. male with PMH significant for recent diagnosis of pancreatic cancer with liver mets, A-fib, hypertension, gout. Patient was diagnosed with pancreatic cancer 2 weeks ago 1/20-patient presented to ED for shortness of breath, unintentional weight loss.  Left without being seen 1/23, seen by PCP Dr. Ouida Sills.  Labs showed bilirubin elevated to 7.5, alk phos 295 1/24, CT scan showed multiple ill-defined hepatic lobe densities consistent with hepatic metastasis 1/24, right upper quadrant ultrasound showed liver cirrhosis, numerous heterogeneous liver masses consistent with metastatic disease. 1/30, MRI abdomen showed large pancreatic mass of the distal tail of pancreas invading splenic hilum invading the capsule overlying the upper pole of the left kidney.  Innumerable peripherally enhancing lesions scattered throughout both lobes of liver 1/30, bilirubin up to 20. 1/31, ultrasound-guided liver biopsy was done which ultimately showed adenocarcinoma consistent with pancreatic origin.  He was evaluated by GI and IR.  Hyperbilirubinemia is secondary to intrahepatic obstruction which is not stainable or drainable. 2/3, patient was seen by oncology Dr. Tasia Catchings for primary pancreatic adenocarcinoma with extensive liver mets.  His prognosis was deemed extremely poor.  Oncologist discussed the findings reports and prognosis with patient and his daughter.  He was seen by palliative care NP Altha Harm the same day.  Comfort care/end-of-life discussions were done.  He was made DNR/DNI and referred for inpatient hospice.  For symptomatic management, he was started on low-dose dexamethasone and morphine. Per family, he was getting  hospice nurse visit at home.  In the interval patient continued to progressively decline.  He was unable to his activities of daily living.  Family was struggling to take care of him  2/12, patient presented to the ED with progressively worsening abdominal pain, nausea, shortness of breath, lethargy. Labs showed bilirubin further up to 42, WBC up to 17 CT of the abdomen and pelvis without contrast shows trace perihepatic and pelvic ascites with innumerable mets metastases to the liver with nodules in base of lungs bilaterally.   Patient was admitted to hospitalist service  Subjective: Patient was seen and examined this morning.  Pleasant elderly African-American male.  Sad affect.  Pain controlled.  Understands the severity of his diagnosis and poor prognosis.  Await residential hospice plan chart reviewed. Blood pressure running low in 80s this morning. I called and discussed with patient's daughter Ms. Sheryn Bison.  She is in agreement with the plan of residential hospice placement. Social work consulted  Assessment/Plan: Principal Problem:   Acute liver failure Active Problems:   Hyperbilirubinemia   Pancreatic adenocarcinoma (HCC)   Essential hypertension   Liver metastasis (HCC)   PAF (paroxysmal atrial fibrillation) (HCC)   AKI (acute kidney injury) ([place])   Comfort measures End-of-life care -Diagnosed with primary pancreatic cancer with extensive liver mets in last 1 month.  With rapidly progressive liver failure and severe symptoms -Was under home hospice services but family did not feel that the level of care was adequate and brought him to the ED -Discussed with patient and daughter.  Plan for residential hospice -Comfort care measures activated with morphine drip, IV Zofran -At this time, unable to predict the longevity.  Discharged to residential hospice  Other active issues identified but currently not getting treatment for. Acute liver  failure AKI A-fib  Discharge Exam:   Vitals:   12/25/21 1157 12/25/21 1200 12/25/21 1215 12/25/21 1235  BP:  94/78 94/82 111/84  Pulse:  82 70 84  Resp:  19 16 (!) 22  Temp: (!) 97.5 F (36.4 C)     TempSrc: Axillary     SpO2:  92% 94% 95%    There is no height or weight on file to calculate BMI.  General exam: Pleasant, elderly African-American male.  Pain controlled. I did not do a detailed examination because of comfort care status  Follow ups:    Discharge Instructions:    Discharge Medications:   Allergies as of 12/25/2021   No Known Allergies      Medication List     STOP taking these medications    allopurinol 100 MG tablet Commonly known as: ZYLOPRIM   allopurinol 300 MG tablet Commonly known as: ZYLOPRIM   carvedilol 25 MG tablet Commonly known as: COREG   dexamethasone 4 MG tablet Commonly known as: DECADRON   lisinopril 40 MG tablet Commonly known as: ZESTRIL   spironolactone 25 MG tablet Commonly known as: ALDACTONE   tadalafil 5 MG tablet Commonly known as: CIALIS       TAKE these medications    feeding supplement Liqd Take 237 mLs by mouth 3 (three) times daily between meals.   morphine 1 mg/mL Soln infusion Inject 1 mg/hr into the vein continuous.   morphine CONCENTRATE 10 mg / 0.5 ml concentrated solution Take 0.25-0.5 mLs (5-10 mg total) by mouth every 2 (two) hours as needed for severe pain.   ondansetron 8 MG tablet Commonly known as: ZOFRAN Take 1 tablet (8 mg total) by mouth every 8 (eight) hours as needed for nausea or vomiting. What changed: Another medication with the same name was added. Make sure you understand how and when to take each.   ondansetron 4 MG/2ML Soln injection Commonly known as: ZOFRAN Inject 2 mLs (4 mg total) into the vein every 6 (six) hours as needed for nausea. What changed: You were already taking a medication with the same name, and this prescription was added. Make sure you understand how  and when to take each.         The results of significant diagnostics from this hospitalization (including imaging, microbiology, ancillary and laboratory) are listed below for reference.    Procedures and Diagnostic Studies:   CT ABDOMEN PELVIS WO CONTRAST  Result Date: 12/25/2021 CLINICAL DATA:  Ascites. diagnosed with Pancreatic Cancer and daughter has noticed that pt has been weak, not wanting to eat, SHOB on exertion. Abdomen noted to be distended and rigid, Denies any pain. Pt A/O x4. EXAM: CT ABDOMEN AND PELVIS WITHOUT CONTRAST TECHNIQUE: Multidetector CT imaging of the abdomen and pelvis was performed following the standard protocol without IV contrast. RADIATION DOSE REDUCTION: This exam was performed according to the departmental dose-optimization program which includes automated exposure control, adjustment of the mA and/or kV according to patient size and/or use of iterative reconstruction technique. COMPARISON:  MRI abdomen 12/11/2021, CT angiography chest 12/05/2021 FINDINGS: Lower chest: Redemonstration of right lower lobe 1.3 x 1.2 cm ground-glass airspace opacity. Linear atelectasis versus scarring. Question left base pulmonary nodules measuring 0.4 cm and 1.2 cm Hepatobiliary: Innumerable hypodense hepatic lesions that are better evaluated on MRI abdomen 12/11/2021. Cholelithiasis. Likely contracted gallbladder with associated motion artifact. Markedly limited evaluation for gallbladder wall thickening or pericholecystic fluid. No biliary dilatation. Pancreas: Vague hypodensity in fullness along the pancreatic tail consistent with known  pancreatic mass that is better evaluated on MR abdomen 12/11/2021 (3:27). The lesion is noted to abut the splenic hilum. Otherwise normal pancreatic contour. No surrounding inflammatory changes. No main pancreatic ductal dilatation. Spleen: Normal in size without focal abnormality. Adrenals/Urinary Tract: Vague pericentimeter right adrenal gland nodule  consistent with reported adenoma on MRI. No left adrenal nodularity. No nephrolithiasis and no hydronephrosis. No definite contour-deforming renal mass. No ureterolithiasis or hydroureter. The urinary bladder is unremarkable. Stomach/Bowel: Stomach is within normal limits. No evidence of bowel wall thickening or dilatation. Scattered colonic diverticulosis. Appendix appears normal. Vascular/Lymphatic: No abdominal aorta aneurysm. The right common iliac artery is enlarged in caliber measuring up to 1.6 cm. The left common iliac artery measures at the upper limits of normal: 1.5 cm. Severe calcified and noncalcified atherosclerotic plaque of the aorta and its branches. No abdominal, pelvic, or inguinal lymphadenopathy. Reproductive: Prostate is enlarged in caliber measuring up to 5.6 cm. Other: Trace perihepatic simple free fluid. Trace free fluid within the pelvis. No intraperitoneal free gas. No organized fluid collection. Musculoskeletal: No abdominal wall hernia or abnormality. No suspicious lytic or blastic osseous lesions. No acute displaced fracture. Multilevel degenerative changes of the spine. IMPRESSION: 1. Trace perihepatic and pelvic ascites. 2. Innumerable hepatic metastases that are better evaluated on MRI 12/11/2021 in a patient with known pancreatic cancer. 3. Question left base pulmonary nodules measuring 0.4 cm and 1.2 cm. Redemonstration of right lower lobe 1.3 x 1.2 cm ground-glass airspace opacity. 4. Cholelithiasis. Otherwise markedly limited evaluation of the gallbladder. If clinically indicated, consider right upper quadrant ultrasound for a more sensitive evaluation of the gallbladder. 5. Scattered colonic diverticulosis with no acute diverticulitis. 6. Prostatomegaly. 7. 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 8.  Aortic Atherosclerosis (ICD10-I70.0). Electronically Signed   By: Iven Finn M.D.   On: 12/25/2021 01:56   US Abdomen  Complete  Result Date: 12/25/2021 CLINICAL DATA:  Acute liver failure, with known pancreatic cancer and numerous hepatic metastases. EXAM: ABDOMEN ULTRASOUND COMPLETE COMPARISON:  CT without contrast earlier today. FINDINGS: Gallbladder: The gallbladder partially contracted. There are multiple layering stones, largest is 1.4 cm. There is circumferential wall thickening to 9.4 mm. Negative sonographic Murphy sign and no pericholecystic fluid. Common bile duct: Diameter: 6.9 mm. Liver: Again noted is hepatic enlargement with numerous hypodense masses consistent with metastases, largest circumscribable lesion is 3.7 cm in the right lobe. Portal vein is patent on color Doppler imaging with normal direction of blood flow towards the liver. IVC: No abnormality visualized. Pancreas: Obscured by bowel gas. Spleen: Size and appearance within normal limits. Right Kidney: Length: 10.0 cm. Echogenicity is increased. No mass or hydronephrosis visualized. Left Kidney: Length: 10.5 cm. Echogenicity is increased . No mass or hydronephrosis visualized. Abdominal aorta: No aneurysm visualized. Moderate echogenic calcific plaque. Other findings: Small-volume perihepatic ascites. IMPRESSION: 1. Numerous hepatic metastases with hepatic enlargement. 2. Marked circumferential gallbladder thickening, with layering stones. The wall thickening is probably due to hepatic dysfunction or congestive, usually is not due to cholecystitis when it is this thickened. Consider cholecystitis only if clinically suspected. Infiltrative disease is possible but rare. 3. Perihepatic ascites. 4. Prominent common bile duct. 5. Increased echogenicity of the renal cortex on both sides, without significant volume loss. Findings consistent with medical renal disease. 6. Aortic atherosclerosis. 7. Obscured pancreas due to bowel gas. Electronically Signed   By: Telford Nab M.D.   On: 12/25/2021 04:15     Labs:  Basic Metabolic Panel: Recent Labs  Lab  12/24/21 2348 12/25/21 1033  NA 135 138  K 5.4* 5.2*  CL 101 105  CO2 23 23  GLUCOSE 254* 177*  BUN 65* 70*  CREATININE 1.86* 1.95*  CALCIUM 8.9 8.6*   GFR Estimated Creatinine Clearance: 42.5 mL/min (A) (by C-G formula based on SCr of 1.95 mg/dL (H)). Liver Function Tests: Recent Labs  Lab 12/24/21 2348 12/25/21 1033  AST 108* 111*  ALT 141* 142*  ALKPHOS 362* 365*  BILITOT 42.4* 39.3*  PROT 6.1* 5.3*  ALBUMIN 2.0* 1.8*   Recent Labs  Lab 12/24/21 2348  LIPASE 54*   Recent Labs  Lab 12/24/21 2348  AMMONIA 13   Coagulation profile Recent Labs  Lab 12/24/21 2348  INR 1.9*    CBC: Recent Labs  Lab 12/24/21 2348 12/25/21 1033  WBC 17.0* 17.6*  NEUTROABS 15.1*  --   HGB 15.6 13.7  HCT 41.9 37.8*  MCV 77.9* 79.6*  PLT 97* 79*   Cardiac Enzymes: No results for input(s): CKTOTAL, CKMB, CKMBINDEX, TROPONINI in the last 168 hours. BNP: Invalid input(s): POCBNP CBG: No results for input(s): GLUCAP in the last 168 hours. D-Dimer No results for input(s): DDIMER in the last 72 hours. Hgb A1c No results for input(s): HGBA1C in the last 72 hours. Lipid Profile No results for input(s): CHOL, HDL, LDLCALC, TRIG, CHOLHDL, LDLDIRECT in the last 72 hours. Thyroid function studies No results for input(s): TSH, T4TOTAL, T3FREE, THYROIDAB in the last 72 hours.  Invalid input(s): FREET3 Anemia work up No results for input(s): VITAMINB12, FOLATE, FERRITIN, TIBC, IRON, RETICCTPCT in the last 72 hours. Microbiology Recent Results (from the past 240 hour(s))  Resp Panel by RT-PCR (Flu A&B, Covid) Nasopharyngeal Swab     Status: None   Collection Time: 12/25/21  1:07 AM   Specimen: Nasopharyngeal Swab; Nasopharyngeal(NP) swabs in vial transport medium  Result Value Ref Range Status   SARS Coronavirus 2 by RT PCR NEGATIVE NEGATIVE Final    Comment: (NOTE) SARS-CoV-2 target nucleic acids are NOT DETECTED.  The SARS-CoV-2 RNA is generally detectable in upper  respiratory specimens during the acute phase of infection. The lowest concentration of SARS-CoV-2 viral copies this assay can detect is 138 copies/mL. A negative result does not preclude SARS-Cov-2 infection and should not be used as the sole basis for treatment or other patient management decisions. A negative result may occur with  improper specimen collection/handling, submission of specimen other than nasopharyngeal swab, presence of viral mutation(s) within the areas targeted by this assay, and inadequate number of viral copies(<138 copies/mL). A negative result must be combined with clinical observations, patient history, and epidemiological information. The expected result is Negative.  Fact Sheet for Patients:  EntrepreneurPulse.com.au  Fact Sheet for Healthcare Providers:  IncredibleEmployment.be  This test is no t yet approved or cleared by the [place] FDA and  has been authorized for detection and/or diagnosis of SARS-CoV-2 by FDA under an Emergency Use Authorization (EUA). This EUA will remain  in effect (meaning this test can be used) for the duration of the COVID-19 declaration under Section 564(b)(1) of the Act, 21 U.S.C.section 360bbb-3(b)(1), unless the authorization is terminated  or revoked sooner.       Influenza A by PCR NEGATIVE NEGATIVE Final   Influenza B by PCR NEGATIVE NEGATIVE Final    Comment: (NOTE) The Xpert Xpress SARS-CoV-2/FLU/RSV plus assay is intended as an aid in the diagnosis of influenza from Nasopharyngeal swab specimens and should not  be used as a sole basis for treatment. Nasal washings and aspirates are unacceptable for Xpert Xpress SARS-CoV-2/FLU/RSV testing.  Fact Sheet for Patients: EntrepreneurPulse.com.au  Fact Sheet for Healthcare Providers: IncredibleEmployment.be  This test is not yet approved or cleared by the [place] FDA and has been  authorized for detection and/or diagnosis of SARS-CoV-2 by FDA under an Emergency Use Authorization (EUA). This EUA will remain in effect (meaning this test can be used) for the duration of the COVID-19 declaration under Section 564(b)(1) of the Act, 21 U.S.C. section 360bbb-3(b)(1), unless the authorization is terminated or revoked.  Performed at [facility], [place] [address] [place], [place] [address]     Time coordinating discharge: 35 minutes  Signed: Jahzion Brogden  [facility] 12/25/2021, 1:04 PM

## 2021-12-25 NOTE — ED Notes (Signed)
Pt was found on the foot of the bed peeing. When asked what he was doing pt stated "I thought this was the bathroom" Pt pulled off cardiac cords and gown. Pt cleaned up, new gown provided and is back on the monitor. Pt A/O to self.

## 2021-12-25 NOTE — ED Notes (Signed)
Breakfast orders Placed °

## 2021-12-25 NOTE — ED Notes (Signed)
Patient transported to Ultrasound 

## 2021-12-25 NOTE — Discharge Planning (Signed)
RNCM received call back from [facility] regarding referral for [facility].  Bevely Palmer (liaison) is working on authorization and bed availability.  RNCM will continue to follow.

## 2021-12-25 NOTE — Discharge Planning (Signed)
Bevely Palmer called back regarding pt acceptance to residential hospice on their [place].  Bevely Palmer has confirmed with pt daughter and will call back with room assignment after obtaining signed consent from daughter.  RNCM made care team aware via internal chat.

## 2021-12-25 NOTE — Discharge Planning (Signed)
RNCM is seeking post-discharge placement for this patient at the following level of care: Residential Hospice. Family accepted offer to [place].  Consents are complete and pt may be transported there.

## 2021-12-28 ENCOUNTER — Inpatient Hospital Stay: Payer: Medicare Other | Admitting: Licensed Clinical Social Worker

## 2021-12-28 ENCOUNTER — Inpatient Hospital Stay: Payer: Medicare Other

## 2022-01-10 DEATH — deceased

## 2022-01-11 ENCOUNTER — Telehealth: Payer: Self-pay | Admitting: *Deleted

## 2022-01-11 NOTE — Telephone Encounter (Signed)
01/11/22- RN received an FMLA form for patient's daughter, Marty Heck from [place]. Daughter requested intermittent leave. Reviewed patient's chart. Pt is deceased. ? ?I left a detailed vm for daughter to return my phone call to discuss the fmla form. I would like to clarify if daughter needs specific past dates covered in fmla (such as previous hospital stays/office visits). ?

## 2023-05-08 IMAGING — US US BIOPSY CORE LIVER
2 series · 13 of 16 positions shown · non-contrast
Comparison: none

INDICATION: 70-year-old gentleman with pancreatic mass and multiple hepatic
metastatic lesions presents to IR for ultrasound-guided biopsy.

[Series 1: us biopsy core liver · 0.25mm/px · 12 of 15 slices shown (1 of 2)]
[im 1/15]
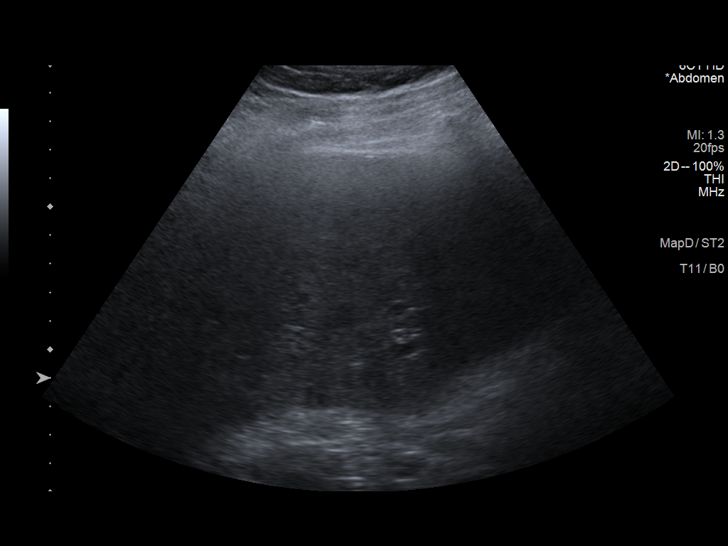
[im 2/15]
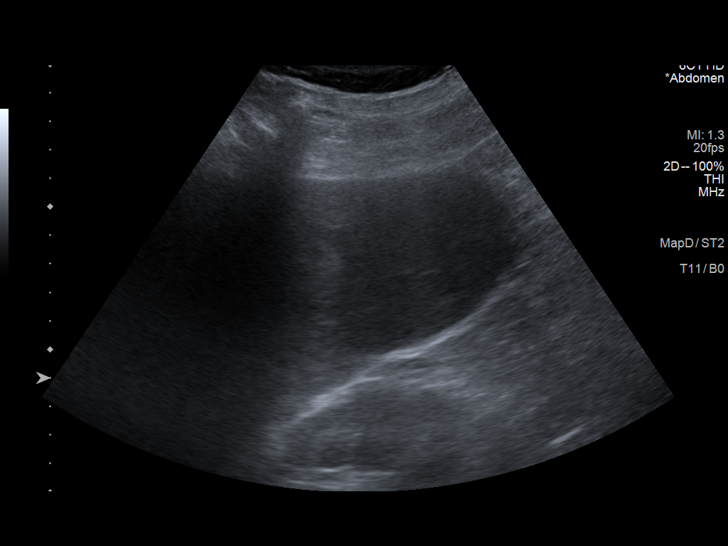
[im 4/15]
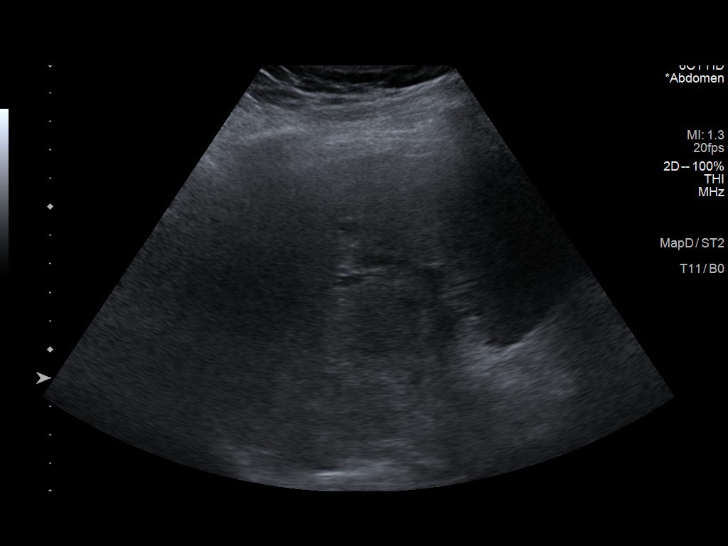
[im 5/15]
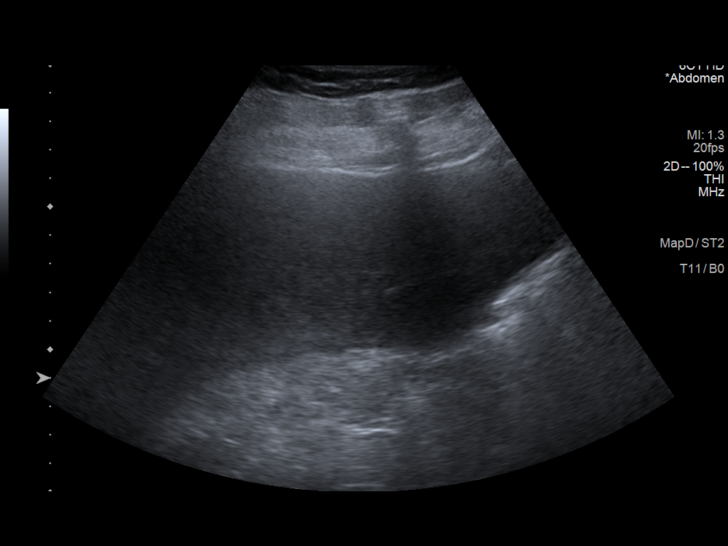
[im 6/15]
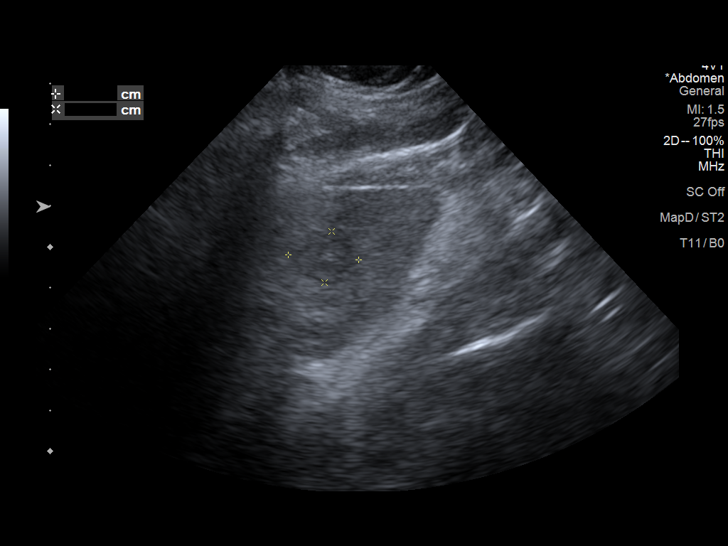
[im 7/15]
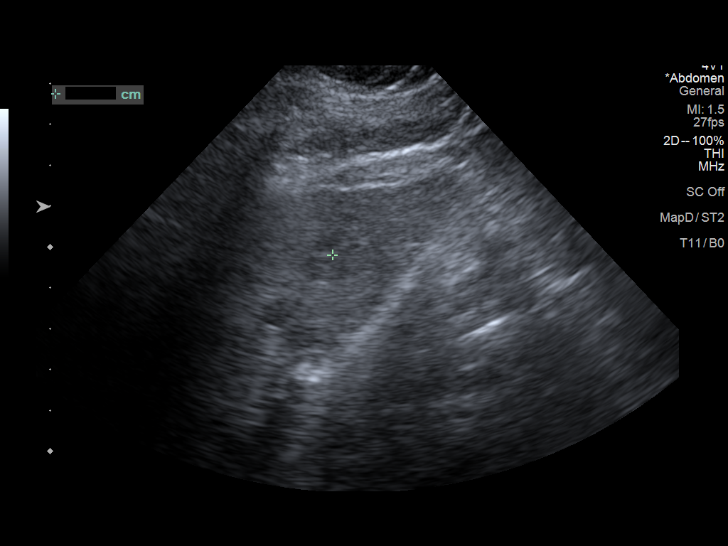
[im 9/15]
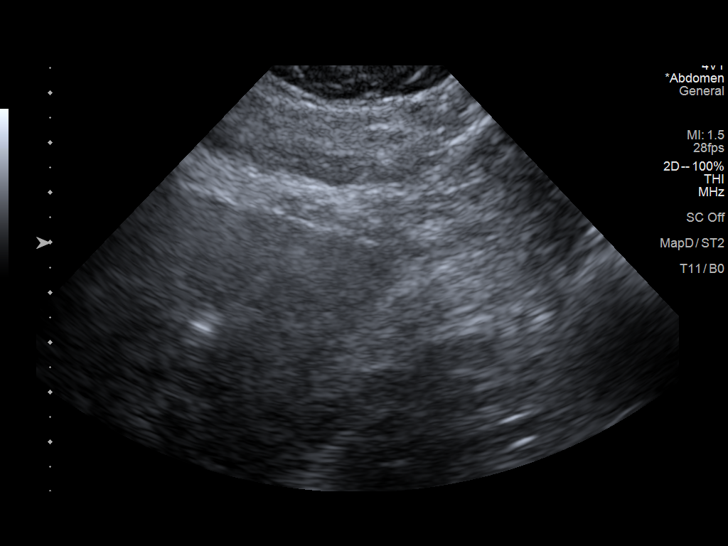
[im 10/15]
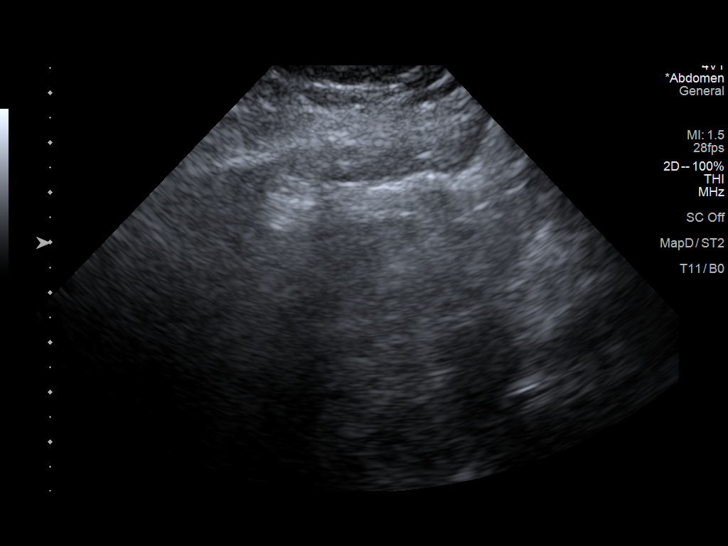
[im 11/15]
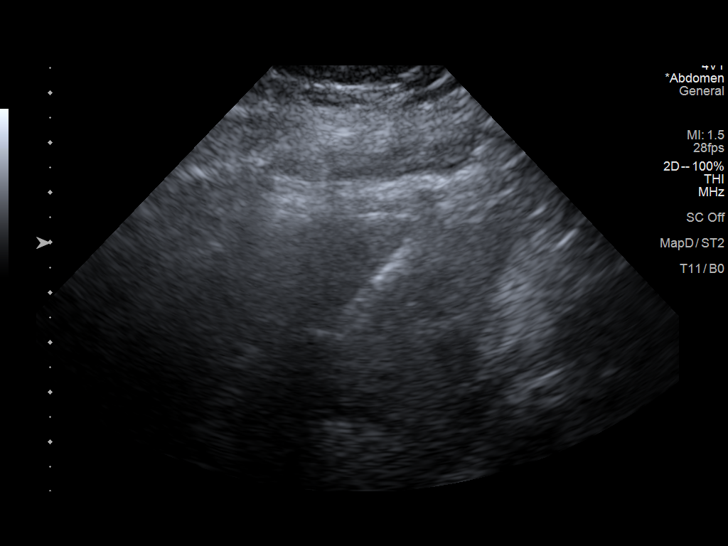
[im 12/15]
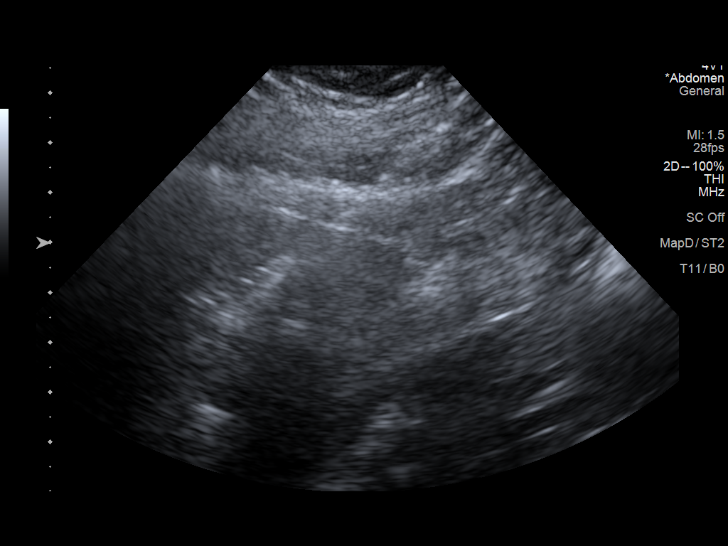
[im 13/15]
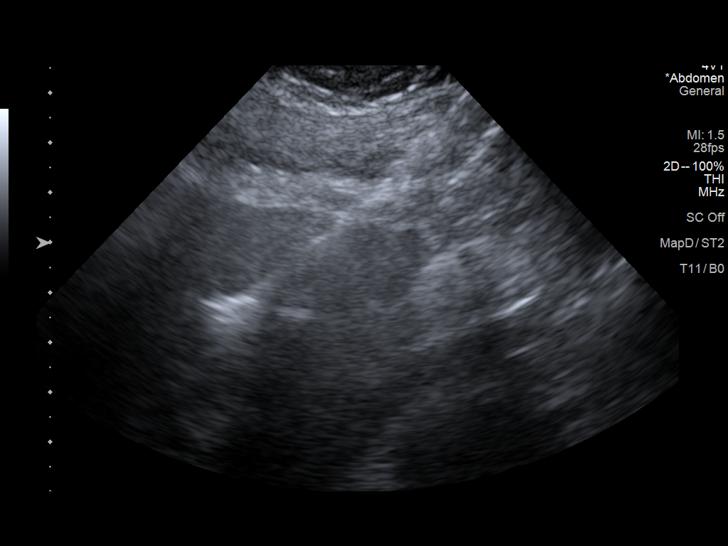
[im 15/15]
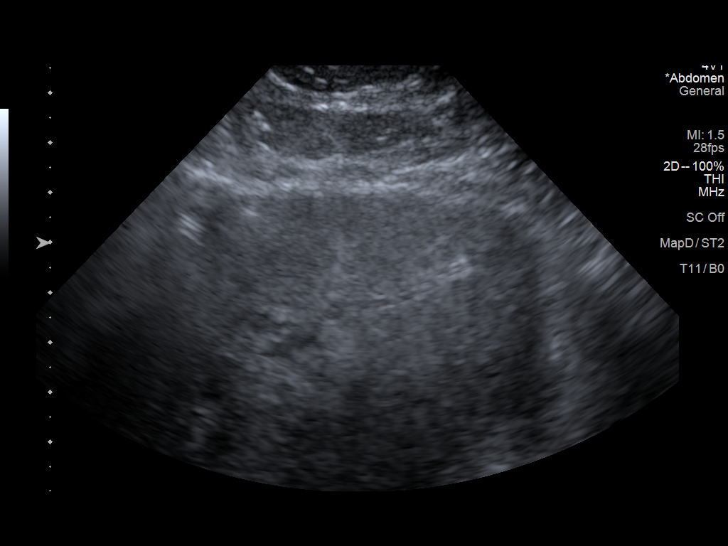

[Series 2: us biopsy core liver · 0.25mm/px · 1 of 1 slices shown (2 of 2)]
[im 1/1]
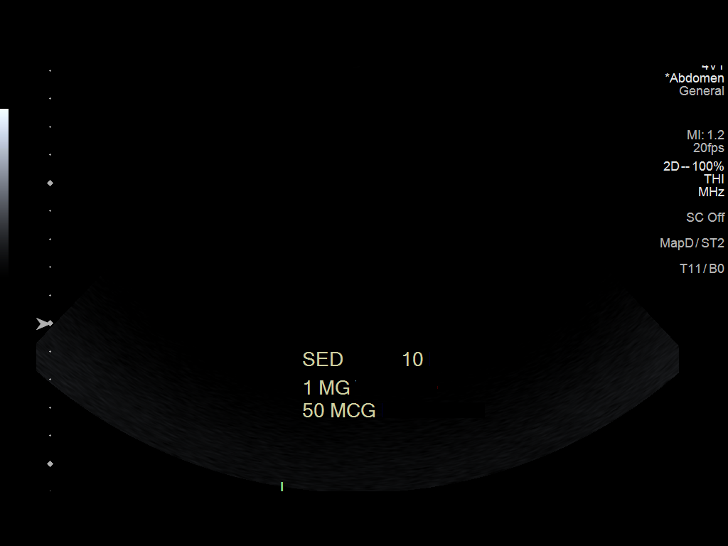

[13 of 16 positions shown; findings below may reference images not displayed]

EXAM:
Ultrasound-guided biopsy of left hepatic mass.

MEDICATIONS:
None.

ANESTHESIA/SEDATION:
Moderate (conscious) sedation was employed during this procedure. A
total of Versed 1 mg and Fentanyl 50 mcg was administered
intravenously.

Moderate Sedation Time: 10 minutes. The patient's level of
consciousness and vital signs were monitored continuously by
radiology nursing throughout the procedure under my direct
supervision.

COMPLICATIONS:
None immediate.

PROCEDURE:
Informed written consent was obtained from the patient after a
thorough discussion of the procedural risks, benefits and
alternatives. All questions were addressed. Maximal Sterile Barrier
Technique was utilized including caps, mask, sterile gowns, sterile
gloves, sterile drape, hand hygiene and skin antiseptic. A timeout
was performed prior to the initiation of the procedure.

Patient position supine on the ultrasound table.

Epigastric skin prepped and draped in usual sterile fashion.

Following local lidocaine administration, 17 gauge introducer needle
was advanced into 1 of the left hepatic lobe lesion, and 4- 18 gauge
cores were obtained utilizing continuous ultrasound guidance.

Gelfoam slurry was administered through the introducer needle at the
biopsy site.

Samples were sent to pathology in formalin.

Needle removed and hemostasis achieved with 5 minutes of manual
compression.

Post procedure ultrasound images showed no evidence of significant
hemorrhage.
IMPRESSION: Ultrasound-guided biopsy of left hepatic lesion as above.

## 2023-05-21 IMAGING — CT CT ABD-PELV W/O CM
2 of 4 series · 15 of 46 positions shown, 17 images · non-contrast
Comparison: MRI abdomen 12/11/2021, CT angiography chest 12/05/2021

CLINICAL DATA: Ascites. diagnosed with Pancreatic Cancer and
daughter has noticed that pt has been weak, not wanting to eat, SHOB
on exertion. Abdomen noted to be distended and rigid, Denies any
pain. Pt A/O x4.



[Series 3: a/p w/o 5mm · axial · non-contrast · 0.98mm/px · z∈[-484,-74]mm · 12 of 94 slices shown, 14 images]
[im 8/94  soft-tissue]
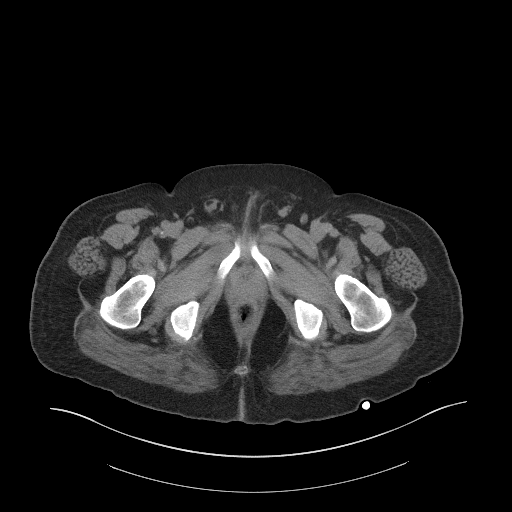
[im 8/94  bone]
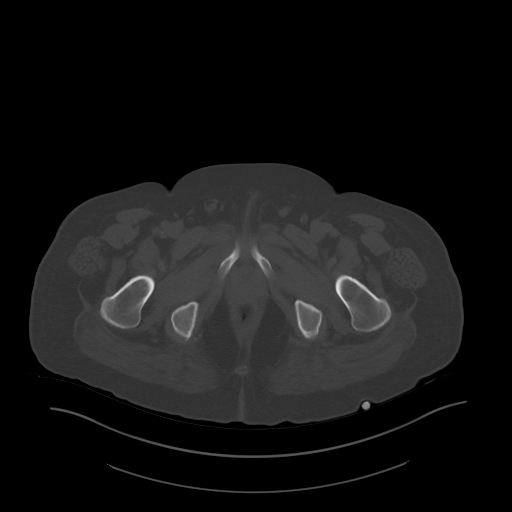
[im 15/94  soft-tissue]
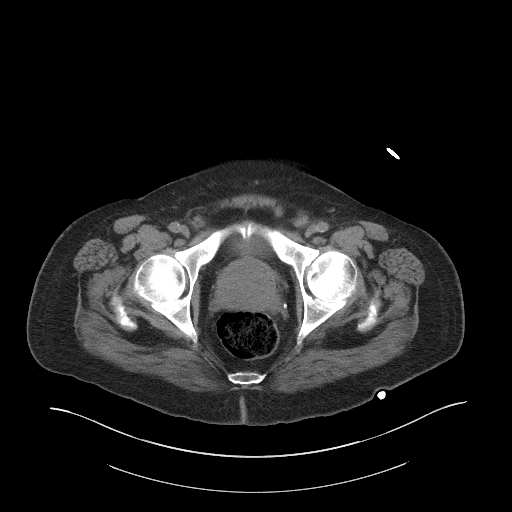
[im 23/94  soft-tissue]
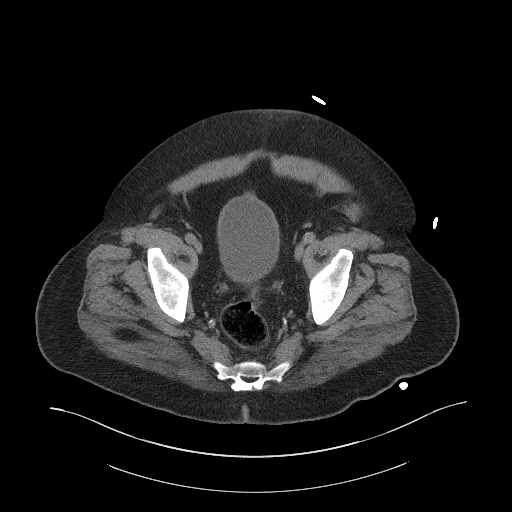
[im 30/94  soft-tissue]
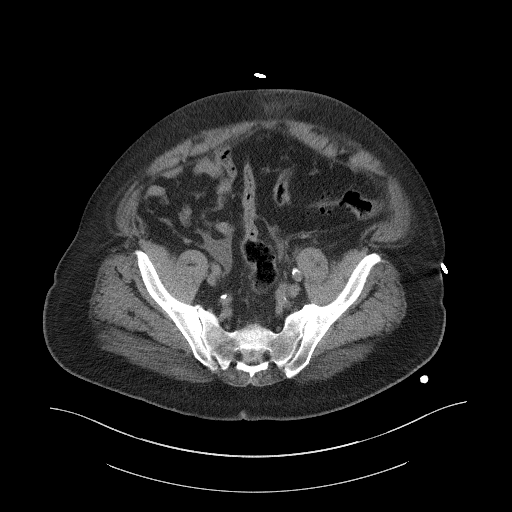
[im 38/94  soft-tissue]
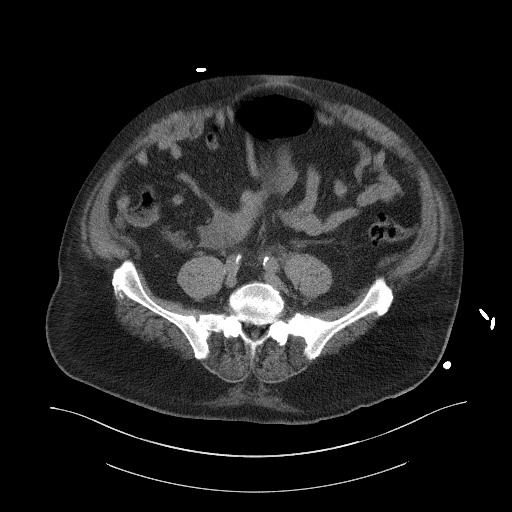
[im 45/94  soft-tissue]
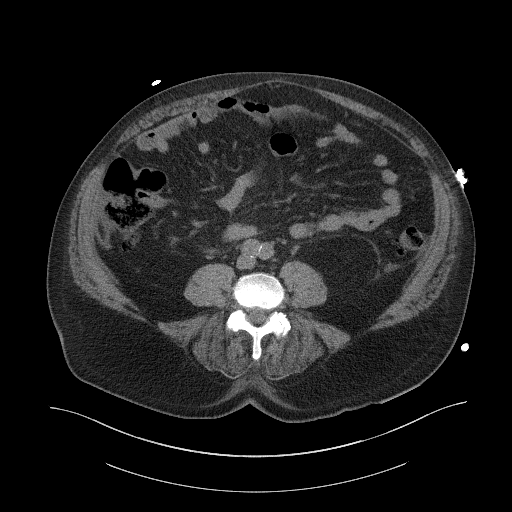
[im 53/94  soft-tissue]
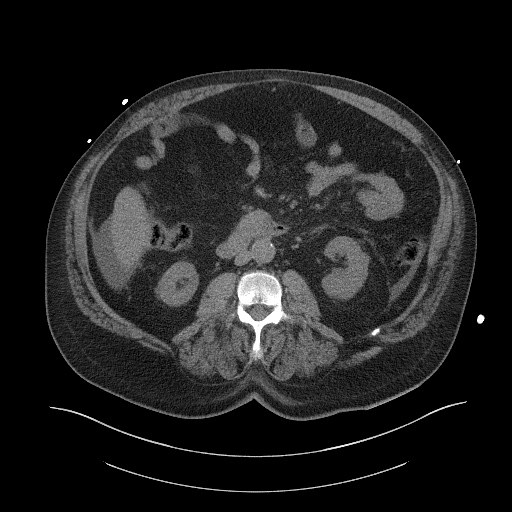
[im 60/94  soft-tissue]
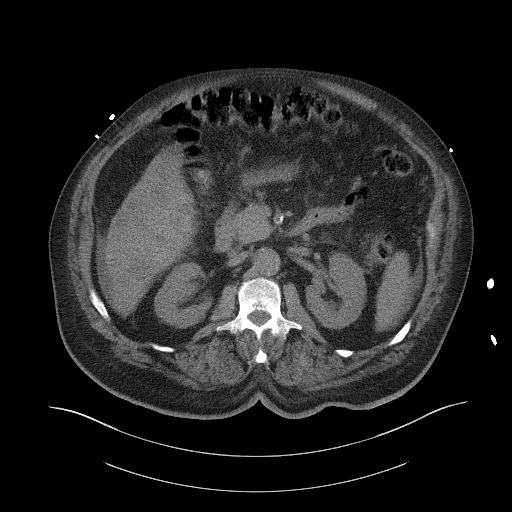
[im 67/94  soft-tissue]
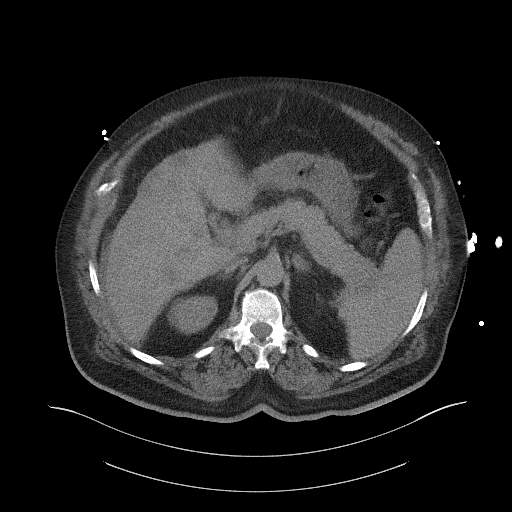
[im 67/94  bone]
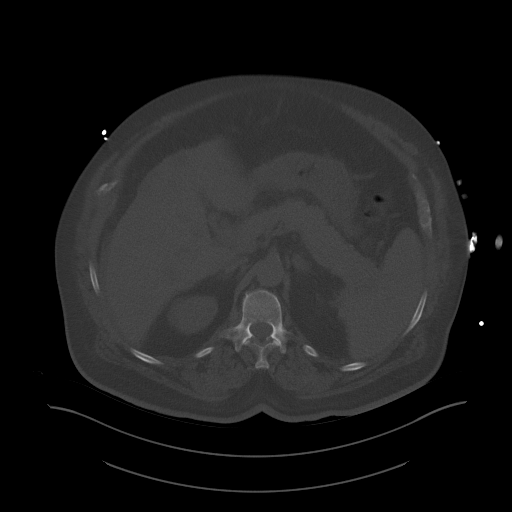
[im 75/94  soft-tissue]
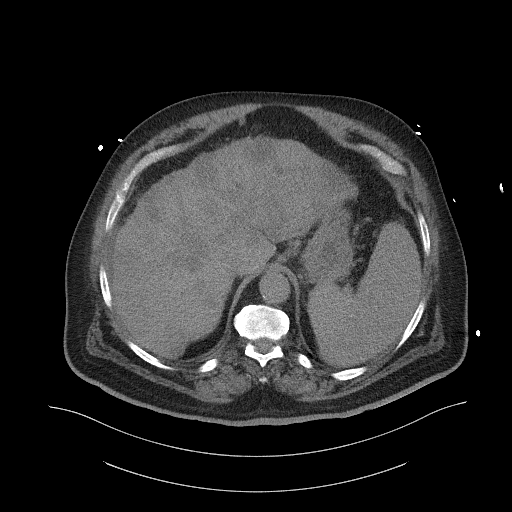
[im 82/94  soft-tissue]
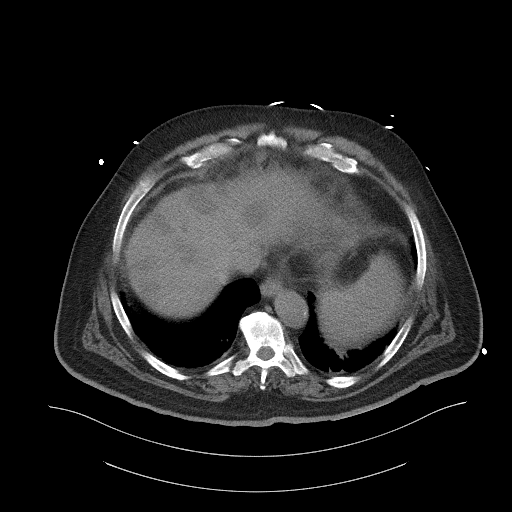
[im 90/94  soft-tissue]
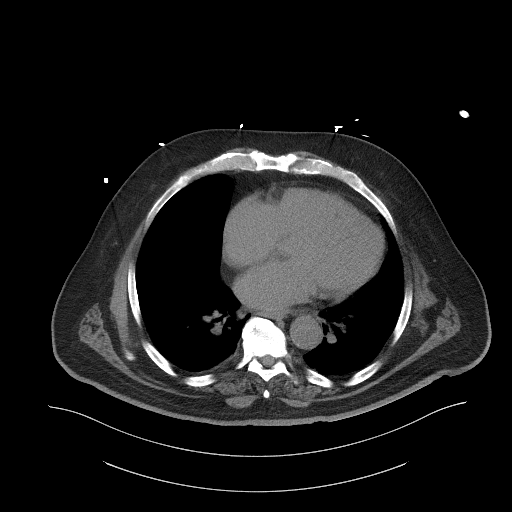

[Series 6: a/p w/o cor · coronal · non-contrast · 0.91mm/px · 3 of 201 slices shown]
[im 67/201  soft-tissue]
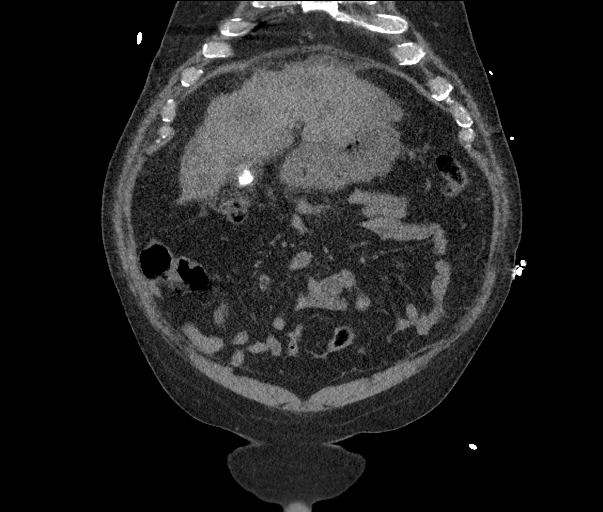
[im 89/201  soft-tissue]
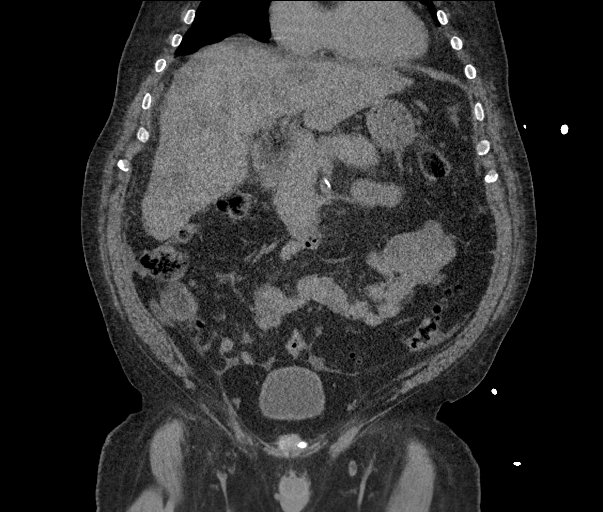
[im 112/201  soft-tissue]
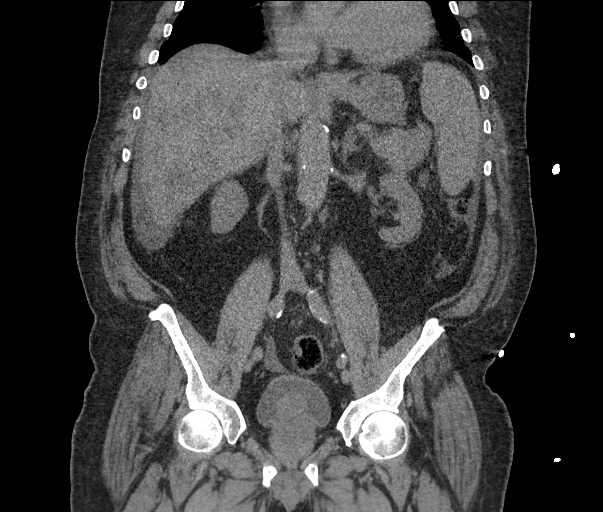

[15 of 46 positions shown; findings below may reference images not displayed]

FINDINGS: Lower chest: Redemonstration of right lower lobe 1.3 x 1.2 cm
ground-glass airspace opacity. Linear atelectasis versus scarring.
Question left base pulmonary nodules measuring 0.4 cm and 1.2 cm

Hepatobiliary: Innumerable hypodense hepatic lesions that are better
evaluated on MRI abdomen 12/11/2021. Cholelithiasis. Likely
contracted gallbladder with associated motion artifact. Markedly
limited evaluation for gallbladder wall thickening or
pericholecystic fluid. No biliary dilatation.

Pancreas: Vague hypodensity in fullness along the pancreatic tail
consistent with known pancreatic mass that is better evaluated on MR
abdomen 12/11/2021 ([DATE]). The lesion is noted to abut the splenic
hilum. Otherwise normal pancreatic contour. No surrounding
inflammatory changes. No main pancreatic ductal dilatation.

Spleen: Normal in size without focal abnormality.

Adrenals/Urinary Tract:

Vague pericentimeter right adrenal gland nodule consistent with
reported adenoma on MRI. No left adrenal nodularity.

No nephrolithiasis and no hydronephrosis. No definite
contour-deforming renal mass.

No ureterolithiasis or hydroureter.

The urinary bladder is unremarkable.

Stomach/Bowel: Stomach is within normal limits. No evidence of bowel
wall thickening or dilatation. Scattered colonic diverticulosis.
Appendix appears normal.

Vascular/Lymphatic: No abdominal aorta aneurysm. The right common
iliac artery is enlarged in caliber measuring up to 1.6 cm. The left
common iliac artery measures at the upper limits of normal: 1.5 cm.
Severe calcified and noncalcified atherosclerotic plaque of the
aorta and its branches. No abdominal, pelvic, or inguinal
lymphadenopathy.

Reproductive: Prostate is enlarged in caliber measuring up to
cm.

Other: Trace perihepatic simple free fluid. Trace free fluid within
the pelvis. No intraperitoneal free gas. No organized fluid
collection.

Musculoskeletal:

No abdominal wall hernia or abnormality.

No suspicious lytic or blastic osseous lesions. No acute displaced
fracture. Multilevel degenerative changes of the spine.
IMPRESSION: 1. Trace perihepatic and pelvic ascites.
2. Innumerable hepatic metastases that are better evaluated on MRI
12/11/2021 in a patient with known pancreatic cancer.
3. Question left base pulmonary nodules measuring 0.4 cm and 1.2 cm.
Redemonstration of right lower lobe 1.3 x 1.2 cm ground-glass
airspace opacity.
4. Cholelithiasis. Otherwise markedly limited evaluation of the
gallbladder. If clinically indicated, consider right upper quadrant
ultrasound for a more sensitive evaluation of the gallbladder.
5. Scattered colonic diverticulosis with no acute diverticulitis.
6. Prostatomegaly.
7. Aneurysmal dilatation of bilateral common iliac arteries (1.6 on
the right, 1.5 on the left).
8.  Aortic Atherosclerosis ([id number]).

## 2023-05-21 IMAGING — US US ABDOMEN COMPLETE
1 series · 13 of 25 positions shown · non-contrast
Comparison: CT without contrast earlier today.

CLINICAL DATA: Acute liver failure, with known pancreatic cancer
and numerous hepatic metastases.

EXAM:
ABDOMEN ULTRASOUND COMPLETE

[Series 1: us abdomen complete · 13 of 84 slices shown]
[im 1/84]
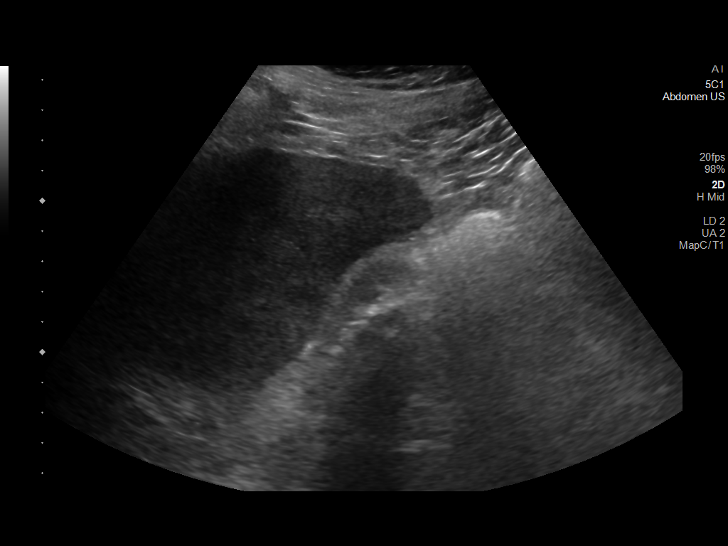
[im 7/84]
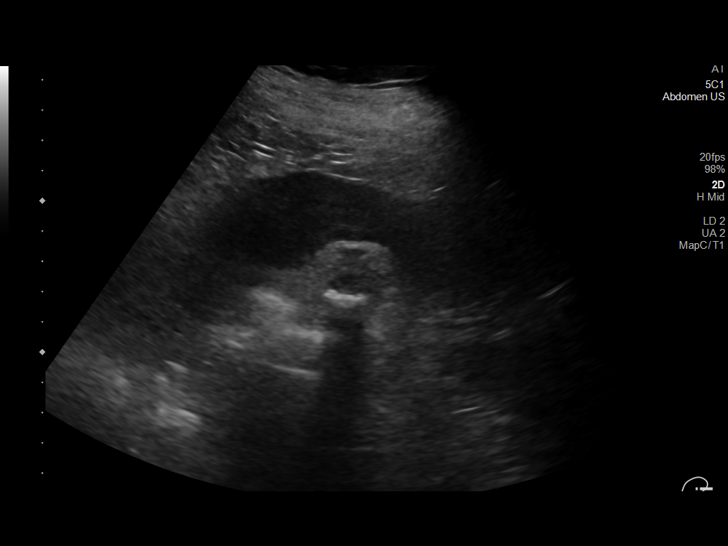
[im 14/84]
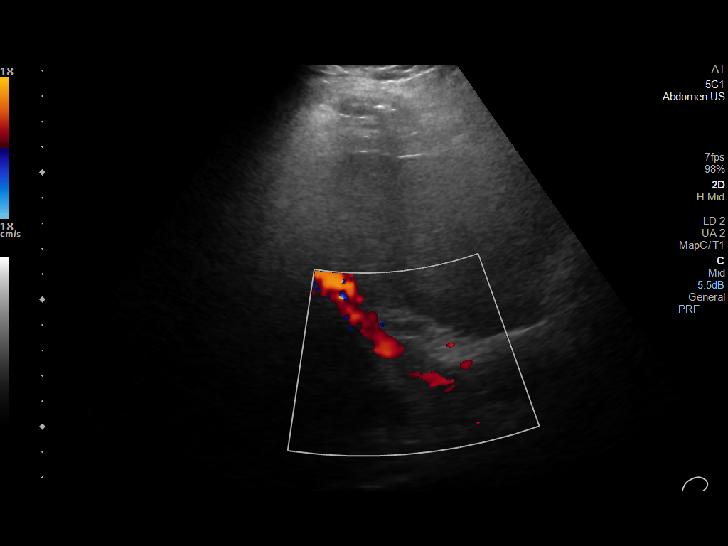
[im 21/84]
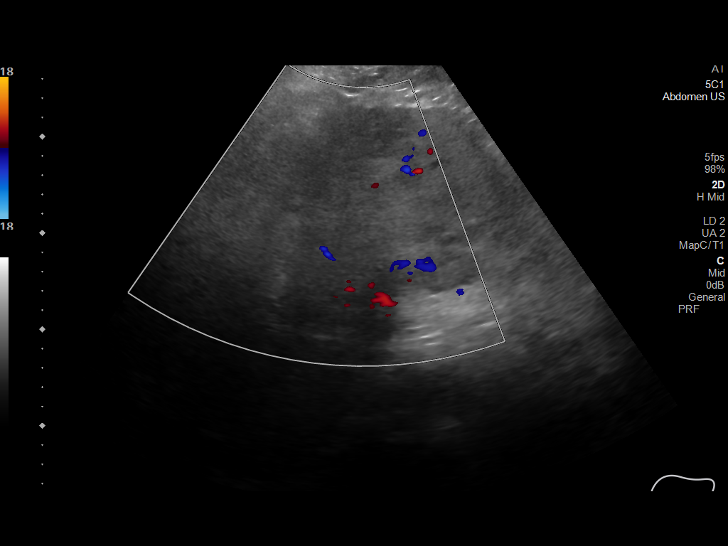
[im 28/84]
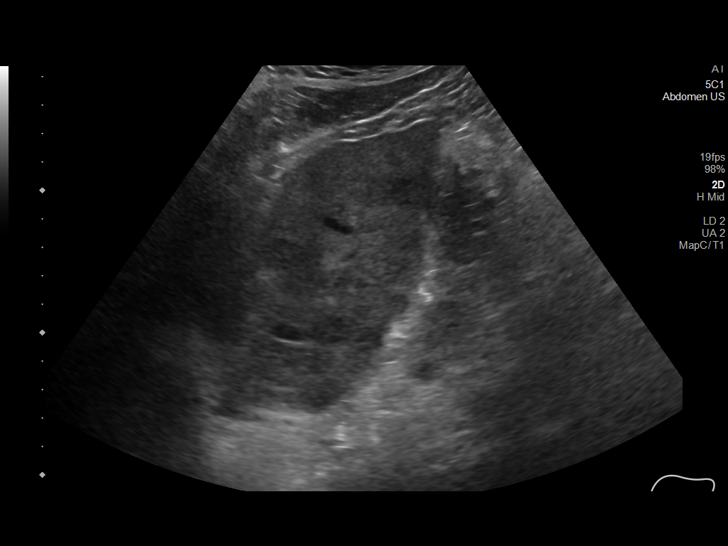
[im 35/84]
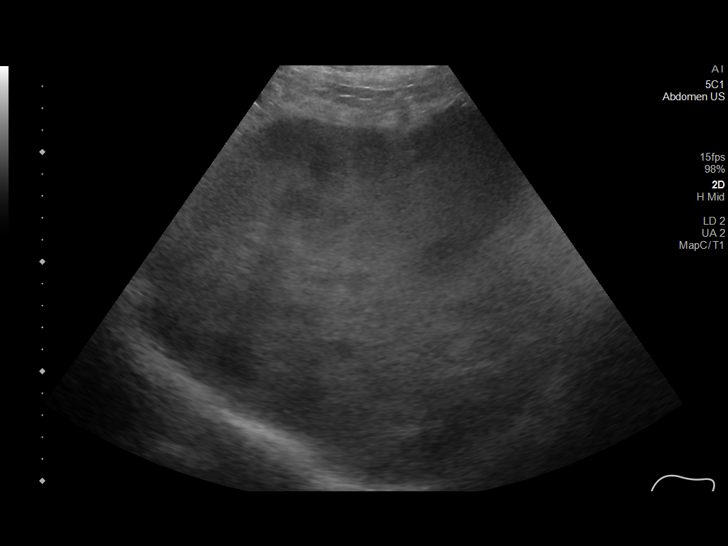
[im 42/84]
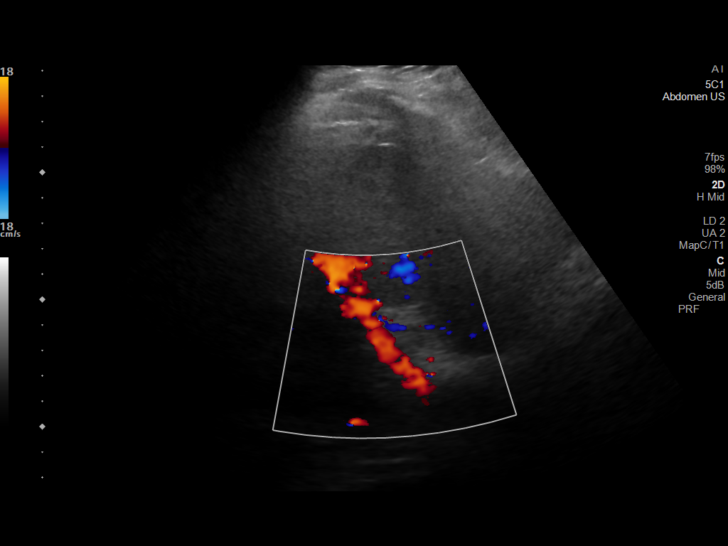
[im 49/84]
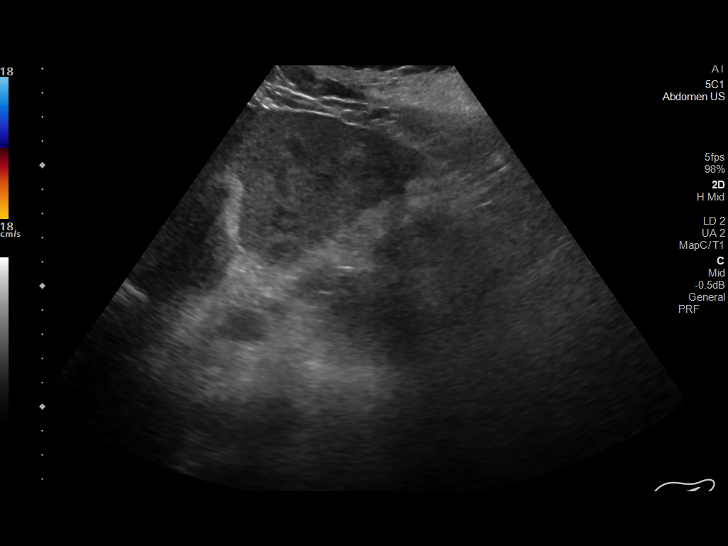
[im 56/84]
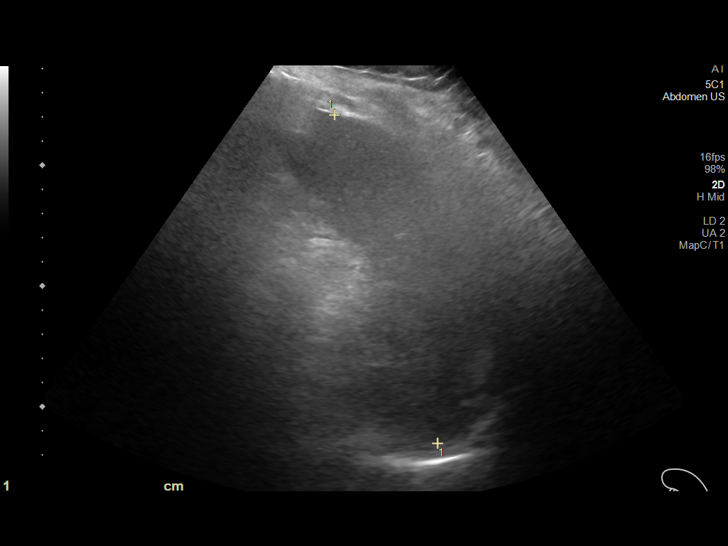
[im 63/84]
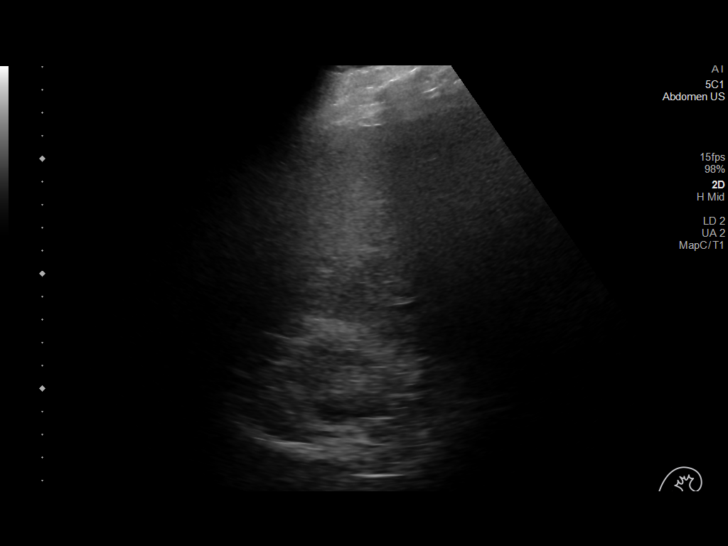
[im 70/84]
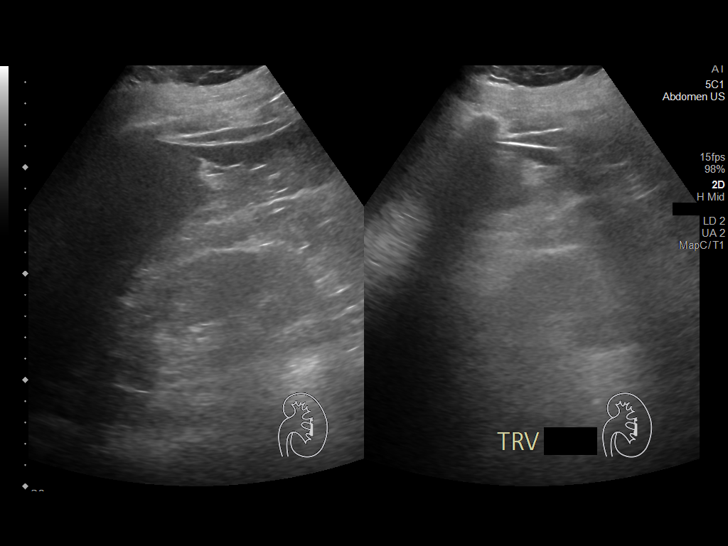
[im 77/84]
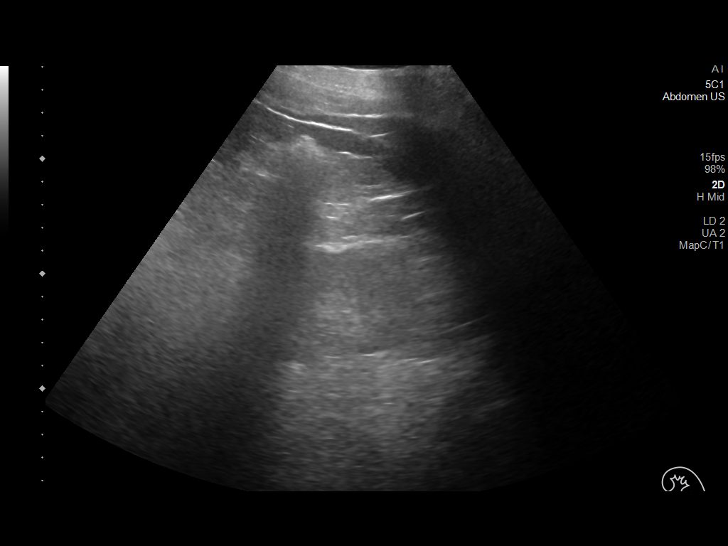
[im 84/84]
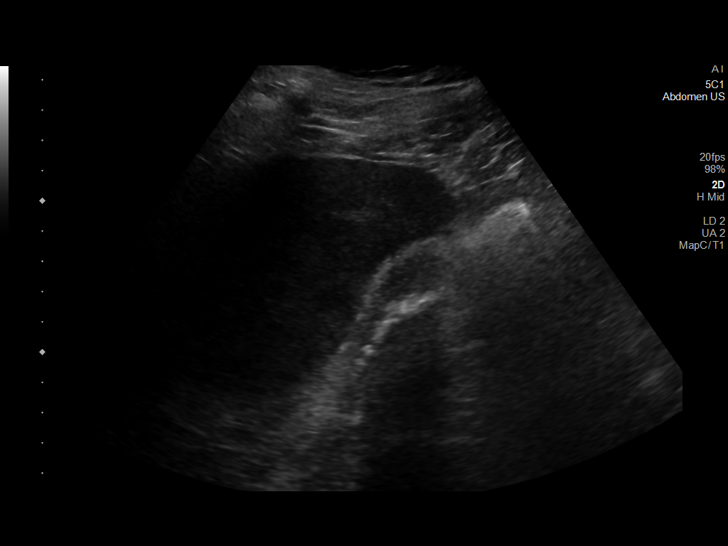

[13 of 25 positions shown; findings below may reference images not displayed]

FINDINGS: Gallbladder: The gallbladder partially contracted. There are
multiple layering stones, largest is 1.4 cm. There is
circumferential wall thickening to 9.4 mm. Negative sonographic
Murphy sign and no pericholecystic fluid.

Common bile duct: Diameter: 6.9 mm.

Liver: Again noted is hepatic enlargement with numerous hypodense
masses consistent with metastases, largest circumscribable lesion is
3.7 cm in the right lobe. Portal vein is patent on color Doppler
imaging with normal direction of blood flow towards the liver.

IVC: No abnormality visualized.

Pancreas: Obscured by bowel gas.

Spleen: Size and appearance within normal limits.

Right Kidney: Length: 10.0 cm. Echogenicity is increased. No mass or
hydronephrosis visualized.

Left Kidney: Length: 10.5 cm. Echogenicity is increased . No mass or
hydronephrosis visualized.

Abdominal aorta: No aneurysm visualized. Moderate echogenic calcific
plaque.

Other findings: Small-volume perihepatic ascites.
IMPRESSION: 1. Numerous hepatic metastases with hepatic enlargement.
2. Marked circumferential gallbladder thickening, with layering
stones. The wall thickening is probably due to hepatic dysfunction
or congestive, usually is not due to cholecystitis when it is this
thickened. Consider cholecystitis only if clinically suspected.
Infiltrative disease is possible but rare.
3. Perihepatic ascites.
4. Prominent common bile duct.
5. Increased echogenicity of the renal cortex on both sides, without
significant volume loss. Findings consistent with medical renal
disease.
6. Aortic atherosclerosis.
7. Obscured pancreas due to bowel gas.
